# Patient Record
Sex: Male | Born: 1985 | Race: Black or African American | Hispanic: No | Marital: Single | Smoking: Current every day smoker
Health system: Southern US, Community
[De-identification: ages and names within clinical notes are randomized; demographics above are authoritative.]

---
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
## 2009-11-15 ENCOUNTER — Emergency Department (HOSPITAL_COMMUNITY): Admission: EM | Admit: 2009-11-15 | Discharge: 2009-11-15 | Payer: Self-pay | Admitting: Emergency Medicine

## 2010-03-26 ENCOUNTER — Emergency Department (HOSPITAL_COMMUNITY): Admission: EM | Admit: 2010-03-26 | Discharge: 2010-03-26 | Payer: Self-pay | Admitting: Emergency Medicine

## 2010-08-13 LAB — GC/CHLAMYDIA PROBE AMP, GENITAL: Chlamydia, DNA Probe: NEGATIVE

## 2010-11-25 ENCOUNTER — Emergency Department (HOSPITAL_COMMUNITY)
Admission: EM | Admit: 2010-11-25 | Discharge: 2010-11-25 | Disposition: A | Discharge status: Home / Self Care | Payer: Self-pay | Attending: Emergency Medicine | Admitting: Emergency Medicine

## 2010-11-25 DIAGNOSIS — R112 Nausea with vomiting, unspecified: Secondary | ICD-10-CM | POA: Insufficient documentation

## 2010-11-25 DIAGNOSIS — R197 Diarrhea, unspecified: Secondary | ICD-10-CM | POA: Insufficient documentation

## 2010-11-25 DIAGNOSIS — R1013 Epigastric pain: Secondary | ICD-10-CM | POA: Insufficient documentation

## 2010-11-25 LAB — COMPREHENSIVE METABOLIC PANEL
AST: 24 U/L (ref 0–37)
Albumin: 4.1 g/dL (ref 3.5–5.2)
Calcium: 9.3 mg/dL (ref 8.4–10.5)
Chloride: 96 mEq/L (ref 96–112)
Creatinine, Ser: 1.2 mg/dL (ref 0.50–1.35)
Total Bilirubin: 0.7 mg/dL (ref 0.3–1.2)
Total Protein: 8.4 g/dL — ABNORMAL HIGH (ref 6.0–8.3)

## 2010-11-25 LAB — CBC
MCV: 87.1 fL (ref 78.0–100.0)
Platelets: 255 10*3/uL (ref 150–400)
RDW: 12.8 % (ref 11.5–15.5)
WBC: 5.1 10*3/uL (ref 4.0–10.5)

## 2013-10-01 ENCOUNTER — Telehealth (HOSPITAL_BASED_OUTPATIENT_CLINIC_OR_DEPARTMENT_OTHER): Payer: Self-pay

## 2013-10-01 NOTE — Telephone Encounter (Signed)
State calling to see if pt was tested for syphilis back in 6/11 when he tested (+) for gonorrhea.  No RPR testing found.

## 2016-11-23 ENCOUNTER — Encounter (HOSPITAL_COMMUNITY): Payer: Self-pay | Admitting: *Deleted

## 2016-11-23 ENCOUNTER — Emergency Department (HOSPITAL_COMMUNITY)
Admission: EM | Admit: 2016-11-23 | Discharge: 2016-11-23 | Disposition: A | Discharge status: Home / Self Care | Payer: Self-pay | Attending: Emergency Medicine | Admitting: Emergency Medicine

## 2016-11-23 DIAGNOSIS — F1910 Other psychoactive substance abuse, uncomplicated: Secondary | ICD-10-CM | POA: Insufficient documentation

## 2016-11-23 DIAGNOSIS — F17210 Nicotine dependence, cigarettes, uncomplicated: Secondary | ICD-10-CM | POA: Insufficient documentation

## 2016-11-23 DIAGNOSIS — Z79899 Other long term (current) drug therapy: Secondary | ICD-10-CM | POA: Insufficient documentation

## 2016-11-23 LAB — COMPREHENSIVE METABOLIC PANEL
ALBUMIN: 4.3 g/dL (ref 3.5–5.0)
ALT: 25 U/L (ref 17–63)
ANION GAP: 7 (ref 5–15)
AST: 30 U/L (ref 15–41)
Alkaline Phosphatase: 97 U/L (ref 38–126)
BUN: 8 mg/dL (ref 6–20)
CO2: 20 mmol/L — AB (ref 22–32)
Calcium: 9.3 mg/dL (ref 8.9–10.3)
Chloride: 109 mmol/L (ref 101–111)
Creatinine, Ser: 1.15 mg/dL (ref 0.61–1.24)
GFR calc Af Amer: >60 mL/min (ref >60)
GFR calc non Af Amer: >60 mL/min (ref >60)
GLUCOSE: 83 mg/dL (ref 65–99)
POTASSIUM: 3.6 mmol/L (ref 3.5–5.1)
SODIUM: 136 mmol/L (ref 135–145)
Total Bilirubin: 0.7 mg/dL (ref 0.3–1.2)
Total Protein: 7.7 g/dL (ref 6.5–8.1)

## 2016-11-23 LAB — RAPID URINE DRUG SCREEN, HOSP PERFORMED
AMPHETAMINES: NOT DETECTED
Barbiturates: NOT DETECTED
Benzodiazepines: NOT DETECTED
Cocaine: POSITIVE — AB
Opiates: NOT DETECTED
TETRAHYDROCANNABINOL: POSITIVE — AB

## 2016-11-23 LAB — ETHANOL: Alcohol, Ethyl (B): <5 mg/dL (ref <5)

## 2016-11-23 LAB — CBC
HCT: 41.6 % (ref 39.0–52.0)
HEMOGLOBIN: 14.6 g/dL (ref 13.0–17.0)
MCH: 31.6 pg (ref 26.0–34.0)
MCHC: 35.1 g/dL (ref 30.0–36.0)
MCV: 90 fL (ref 78.0–100.0)
Platelets: 223 10*3/uL (ref 150–400)
RBC: 4.62 MIL/uL (ref 4.22–5.81)
RDW: 12.7 % (ref 11.5–15.5)
WBC: 5.1 10*3/uL (ref 4.0–10.5)

## 2016-11-23 MED ORDER — METHOCARBAMOL 500 MG PO TABS
500.0000 mg | ORAL_TABLET | Freq: Three times a day (TID) | ORAL | Status: DC | PRN
Start: 2016-11-23 — End: 2016-11-23

## 2016-11-23 MED ORDER — LOPERAMIDE HCL 2 MG PO CAPS: 2.0000 mg | ORAL_CAPSULE | ORAL | Status: DC | PRN

## 2016-11-23 MED ORDER — NICOTINE 21 MG/24HR TD PT24
21.0000 mg | MEDICATED_PATCH | Freq: Every day | TRANSDERMAL | Status: DC
  Administered 2016-11-23: 21 mg via TRANSDERMAL
  Filled 2016-11-23: qty 1

## 2016-11-23 MED ORDER — NAPROXEN 250 MG PO TABS: 500.0000 mg | ORAL_TABLET | Freq: Two times a day (BID) | ORAL | Status: DC | PRN

## 2016-11-23 MED ORDER — DICYCLOMINE HCL 20 MG PO TABS: 20.0000 mg | ORAL_TABLET | Freq: Four times a day (QID) | ORAL | Status: DC | PRN

## 2016-11-23 MED ORDER — ONDANSETRON 4 MG PO TBDP: 4.0000 mg | ORAL_TABLET | Freq: Four times a day (QID) | ORAL | Status: DC | PRN

## 2016-11-23 MED ORDER — HYDROXYZINE HCL 25 MG PO TABS: 25.0000 mg | ORAL_TABLET | Freq: Four times a day (QID) | ORAL | Status: DC | PRN

## 2016-11-23 NOTE — ED Notes (Signed)
TTS in progress. Computer malfunctioning, pt using RN's phone [facility] while remaining on video monitor.

## 2016-11-23 NOTE — Discharge Instructions (Addendum)
Substance Abuse Treatment Programs ° °Intensive Outpatient Programs °[facility]     °[address]      °[place], [place]                   °[phone]      ° °[facility] °[address] °[place], [place] °[phone] ° °[facility]     °(Inpatient and outpatient)     °[address]           °[phone]   ° °[facility] °[phone] (Suboxone and Methadone) ° °[address]      °[place], [place] [address]      °[phone]      ° °[address] °[place], [place] °852-3033 ° °[facility] (Outpatient/Inpatient, [facility])    °(insurance only) [phone]      °       °[facility] (Groups & Residential) °[place], [place] °[phone] ° °   °[facility]     °[address]     °[place], [place]      °[phone]      ° °[facility] (for caregivers and family) °[address] °[place], [place] °336-299-4655 ° ° ° ° ° °Residential Treatment Programs °[facility]      °[address], [place], [place] [address]  °([phone]      ° °[facility] °[address] [place], [place] [address] °[phone] ° °[facility]        °336-248-8914      ° °[facility] °1-800-659-3381 ° °[facility]             °[address]                                         °[place], [place]                                                °877-615-2722 or 336-784-9470                              ° °[facility] [place] °[address] °[place], [address] °1.877.941.8954 ° °[facility]    °[address]      °[place], [place]     °[phone]      ° °[facility] °[address] °[place], [place] °336-285-9073 ° °[facility]   °[address]     °[place], [place] [address]     °[phone]      °Admissions: 8am-3pm M-F ° °[facility] °[address] °[place],  [place] °336-227-7417 ° °[facility]: Residential Program (90 Days)   °[place], [place]      °336-725-8389 or 800-758-6077    ° °[facility] °[place], [place] °(Walk in Hours over the weekend or by referral) ° °[facility] °[address], [place],  [address] °([phone] ° °Crisis Mobile: [facility]:  1-877-626-1772 (for crisis response 24 hours a day) °[facility] Hotline:      1-800-256-2452 °Outpatient Psychiatry and Counseling ° °[facility]: Mobile Crisis   Management 24 hours:  1-877-626-1772 ° °[facility] sliding scale fee and walk in schedule: M-F 8am-12pm/1pm-3pm °[address]  °[place], [place] [address] °[phone] ° °[facility] °[address] °[place], [place] [address] °336-703-9650 ° °[facility] ([facility])- new patient walk-in appointments available Monday - Friday 8am -3pm.          °[address] °[place], [place] [address] °336-676-6840 or crisis line- 336-676-6905 ° °[facility] Intensive Outpatient Therapy Program °[address] °[place], [place] [address] °336-832-9804 ° °[facility]                  °Crisis Services      °[phone]      °[address]     °[place], [place] [address]                ° °[facility]   °[facility] °[phone] °[address] °[place], [place] [address] ° ° °[facility]          °[address],  °[place], [place] [address]       °[phone] ° °[facility] °[address] °[place], [place] [address] °336-292-1510 ° °[facility]    °[address]    °[place], [place] [address]     °[phone]      ° °Parish McKinney, MD     °[address]     °[place] [address]     °336-282-1251     °  °[facility] °[address] °[place] [address] ° °[facility]     °[address]     °[place], [place]      °336-542-2076      ° °[facility] °Shamsher Ahluwalia, MD °[address] °[place], [place] [address] °[phone] ° °[facility]     °[address]     °[place], [place] [address]     °336-274-1237      ° °[facility] °[address] °[place], [place] [address] °336-854-4450 °Resources for Temporary Residential Assistance/Crisis Centers ° °DAY CENTERS °[facility] °M-F 8am-3pm   °[address] [place], [place] [address]   336-332-0824 °Services include: laundry, barbering, support groups, case management, phone  & computer access, showers, AA/NA mtgs, mental health/substance abuse nurse, job skills class, disability information, VA assistance, spiritual classes, etc.  ° °HOMELESS SHELTERS ° °[facility]     °[facility]   °[address], [place]     °[phone]       °       °[facility] (women and children)       °[address] °[place], [place] [address] °336-275-0820 °[email or web address] for application and process °Application Required ° °[facility]   °[address]    °[place] [address]     °[phone]       °             °[facility] °[address] °, [place] [address] °336.273.5572 °336-235-0363(schedule application appt.) °Application Required ° °[facility] (women only)    °[address]     °[place],  [address]     °336-884-1039      °  Intake starts 6pm daily °Need valid ID, SSC, & Police report °[facility] [place] °[address] °[place], [place] °336-881-5420 °Application Required ° °[facility] (men only)     °[address]      °[place], [place]     °[phone]      ° °[facility] °(Pregnant women only) °[address] °[place], [place] °336-275-0206 ° °[facility]      °[address]      °[place], [place] [address]     °[phone]      °       °[facility] [place] [facility] °[address] °[place], [place] °336-723-1848 °90 day commitment/SA/Application process ° °[facility](men only)     °[address]     °[place], [place]     °[phone]       °Check-in at 7pm     °       °[facility] °[address] °[place], [place] [address] °336-248-6684 °Men/Women/Women and Children must be there by 7 pm ° °[facility] °[place], [place] °336-722-8721                ° °

## 2016-11-23 NOTE — BH Assessment (Signed)
Tele Assessment Note   Albert Blake is an 31 y.o. male who came to the ED seeking residential treatment for substance abuse. He states that he is "tired of using drugs and alcohol and wants help". He denies SI, HI and AVH but states that his use makes him depressed. He states that he often does "things he isn't proud of" when he is trying to get drugs and ends up feeling guilty and depressed the next day which triggers him to use more. He states that he was in Prison for a total of 4 years for several felony breaking and entering. (He states that this was to get money to get drugs). Pt got out of Prison 2 years ago and started using again. He states that he uses alcohol, marijuana, cocaine, xanex and opiates daily (if he can get xanex and opiates). He reports that he has "burned all his bridges" and has no family support. He is currently homeless and stays at friends houses or on the streets. Pt denies any suicide attempts previously and has never had an inpatient admission or outpatient therapy.   Per Hillery Jacks NP it is recommended that pt be discharged with substance abuse treatment resources.   Diagnosis: Substance induced depressive disorder, Cocaine use disorder, moderate, Opoid use disorder, moderate, Cannabis use disorder severe, alcohol use disorder moderate,   Past Medical History: History reviewed. No pertinent past medical history.  History reviewed. No pertinent surgical history.  Family History: No family history on file.  Social History:  reports that he has been smoking Cigarettes.  He has been smoking about 1.00 pack per day. He has never used smokeless tobacco. He reports that he drinks alcohol. He reports that he uses drugs, including Cocaine.  Additional Social History:  Alcohol / Drug Use Pain Medications: Abuses pain medications when he has access  Prescriptions: uses prescription drugs whenever he can (xanex)  Over the Counter: NA  History of alcohol / drug use?:  Yes Longest period of sobriety (when/how long): 4 years- (was in prison)  Negative Consequences of Use: Financial, Armed forces operational officer, Personal relationships, Work / School Substance #1 Name of Substance 1: Alcohol  1 - Age of First Use: 16 1 - Amount (size/oz): 2 pints liquor  1 - Frequency: daily  1 - Duration: on and off since age 64  1 - Last Use / Amount: yesterday unknown (BAL 0) Substance #2 Name of Substance 2: Marijuana  2 - Age of First Use: 16  2 - Amount (size/oz): 4 blunts 2 - Frequency: daily  2 - Duration: on and off since age 19 2 - Last Use / Amount: today  Substance #3 Name of Substance 3: Cocaine  3 - Age of First Use: 16  3 - Amount (size/oz): unknown 3 - Frequency: daily  3 - Duration: on and off since age 40 3 - Last Use / Amount: last night Substance #4 Name of Substance 4: Xanex  4 - Age of First Use: 64 4 - Amount (size/oz): unknown (whatever he can get)  4 - Frequency: whenever he can get it 4 - Duration: 2 years 4 - Last Use / Amount: unspecified- pt negative Substance #5 Name of Substance 5: Opiates  5 - Age of First Use: 29 5 - Amount (size/oz): unknown (whatever he can get) 5 - Frequency: daily when he can get it 5 - Duration: 2 years  5 - Last Use / Amount: unspecified- pt negative   CIWA: CIWA-Ar BP: (!) 145/89 Pulse  Rate: 78 Nausea and Vomiting: mild nausea with no vomiting Tactile Disturbances: none Tremor: no tremor Auditory Disturbances: not present Paroxysmal Sweats: no sweat visible Visual Disturbances: not present Anxiety: no anxiety, at ease Headache, Fullness in Head: none present Agitation: normal activity Orientation and Clouding of Sensorium: oriented and can do serial additions CIWA-Ar Total: 1 COWS: Clinical Opiate Withdrawal Scale (COWS) Resting Pulse Rate: Pulse Rate 81-100 Sweating: No report of chills or flushing Restlessness: Able to sit still Pupil Size: Pupils pinned or normal size for room light Bone or Joint Aches:  Not present Runny Nose or Tearing: Not present GI Upset: No GI symptoms Tremor: No tremor Yawning: No yawning Anxiety or Irritability: None Gooseflesh Skin: Skin is smooth COWS Total Score: 1  PATIENT STRENGTHS: (choose at least two) Average or above average intelligence Motivation for treatment/growth  Allergies: No Known Allergies  Home Medications:  (Not in a hospital admission)  OB/GYN Status:  No LMP for male patient.  General Assessment Data [facility] Assessment: In system Is this a Tele or Face-to-Face Assessment?: Tele Assessment Is this an Initial Assessment or a Re-assessment for this encounter?: Initial Assessment Marital status: Single Maiden name: NA Is patient pregnant?: No Pregnancy Status: No Living Arrangements: Other (Comment) (Homeless) Can pt return to current living arrangement?: Yes Admission Status: Voluntary Is patient capable of signing voluntary admission?: Yes Referral Source: Self/Family/Friend Insurance type: Self- Pay     Crisis Care Plan Living Arrangements: Other (Comment) (Homeless) Name of Psychiatrist: None Name of Therapist: None  Education Status Is patient currently in school?: No Highest grade of school patient has completed: 10th  Risk to self with the past 6 months Suicidal Ideation: No Has patient been a risk to self within the past 6 months prior to admission? : Yes (due to chronic drug use and criminal activity) Suicidal Intent: No Has patient had any suicidal intent within the past 6 months prior to admission? : No Is patient at risk for suicide?: No Suicidal Plan?: No Has patient had any suicidal plan within the past 6 months prior to admission? : No Access to Means: No What has been your use of drugs/alcohol within the last 12 months?: Using drugs and alcohol daily  Previous Attempts/Gestures: No How many times?: 0 Other Self Harm Risks: no Triggers for Past Attempts: None  known Intentional Self Injurious Behavior: None Family Suicide History: No Recent stressful life event(s): Other (Comment) Persecutory voices/beliefs?: No Depression: Yes Depression Symptoms: Despondent, Feeling worthless/self pity, Feeling angry/irritable Substance abuse history and/or treatment for substance abuse?: Yes Suicide prevention information given to non-admitted patients: Not applicable  Risk to Others within the past 6 months Homicidal Ideation: No Does patient have any lifetime risk of violence toward others beyond the six months prior to admission? : No Thoughts of Harm to Others: Yes-Currently Present Comment - Thoughts of Harm to Others:  (thoughts to harm others when he can't get drugs) Current Homicidal Intent: No Current Homicidal Plan: No Access to Homicidal Means: No Describe Access to Homicidal Means: none Identified Victim:  (None) History of harm to others?: No Assessment of Violence: None Noted Violent Behavior Description: no Does patient have access to weapons?: No Criminal Charges Pending?: No Does patient have a court date: No Is patient on probation?: Unknown  Psychosis Hallucinations: None noted Delusions: None noted  Mental Status Report Appearance/Hygiene: Unremarkable Eye Contact: Good Motor Activity: Freedom of movement Speech: Logical/coherent Level of Consciousness: Alert Mood: Depressed Affect: Appropriate to  circumstance Anxiety Level: Moderate Thought Processes: Coherent Judgement: Partial Orientation: Person, Place, Time, Situation Obsessive Compulsive Thoughts/Behaviors: Moderate  Cognitive Functioning Concentration: Normal Memory: Recent Intact, Remote Intact IQ: Average Insight: Fair Impulse Control: Poor Appetite: Good Weight Loss: 0 Weight Gain: 0 Sleep: Decreased Total Hours of Sleep: 5 Vegetative Symptoms: None  ADLScreening [facility] Assessment Services) Patient's cognitive ability adequate to safely complete  daily activities?: Yes Patient able to express need for assistance with ADLs?: Yes Independently performs ADLs?: Yes (appropriate for developmental age)  Prior Inpatient Therapy Prior Inpatient Therapy: No  Prior Outpatient Therapy Prior Outpatient Therapy: No Reason for Treatment: no Does patient have an ACCT team?: No Does patient have Intensive In-House Services?  : No Does patient have [facility] services? : No Does patient have [facility] services?: No  ADL Screening (condition at time of admission) Patient's cognitive ability adequate to safely complete daily activities?: Yes Is the patient deaf or have difficulty hearing?: No Does the patient have difficulty seeing, even when wearing glasses/contacts?: No Does the patient have difficulty concentrating, remembering, or making decisions?: No Patient able to express need for assistance with ADLs?: Yes Does the patient have difficulty dressing or bathing?: No Independently performs ADLs?: Yes (appropriate for developmental age) Does the patient have difficulty walking or climbing stairs?: No Weakness of Legs: None Weakness of Arms/Hands: None  Home Assistive Devices/Equipment Home Assistive Devices/Equipment: None  Therapy Consults (therapy consults require a physician order) PT Evaluation Needed: No OT Evalulation Needed: No SLP Evaluation Needed: No Abuse/Neglect Assessment (Assessment to be complete while patient is alone) Physical Abuse: Denies Verbal Abuse: Denies Sexual Abuse: Denies Exploitation of patient/patient's resources: Denies Self-Neglect: Denies Values / Beliefs Cultural Requests During Hospitalization: None Spiritual Requests During Hospitalization: None Consults Spiritual Care Consult Needed: No Social Work Consult Needed: No Merchant navy officer (For Healthcare) Does Patient Have a Medical Advance Directive?: No Would patient like information on creating a medical advance directive?: No - Patient  declined Nutrition Screen- [facility] Adult/WL/AP Patient's home diet: Regular Has the patient recently lost weight without trying?: No Has the patient been eating poorly because of a decreased appetite?: No Malnutrition Screening Tool Score: 0  Additional Information 1:1 In Past 12 Months?: No CIRT Risk: No Elopement Risk: No Does patient have medical clearance?: No     Disposition:  Disposition Initial Assessment Completed for this Encounter: Yes Disposition of Patient: Outpatient treatment Type of outpatient treatment: Adult  Lanice Shirts [facility] 11/23/2016 1:44 PM

## 2016-11-23 NOTE — ED Provider Notes (Signed)
Emergency Department Provider Note   I have reviewed the triage vital signs and the nursing notes.   HISTORY  Chief Complaint Drug Problem and Suicidal   HPI Albert Blake is a 31 y.o. male with PMH of polysubstance abuse and tobacco use presents to the emergency department for evaluation of increasing depression and thoughts of self-harm in the setting of polysubstance abuse. Patient states that his depression and thoughts of harming himself occur mainly when he is unable to get adequately "high" on drugs or cannot find drugs. He describes instances where he feels like punching a wall or hurting himself in some other way but denies specific suicidal thinking or planning. No history of suicide attempts in the past. He drinks approximately 2 pints of liquor daily and has not had a history of complicated alcohol withdrawal. He also takes prescription medications which she gets from the street. He states he frequently takes opioids and occasionally Ativan or Xanax. He last took pills and drank alcohol yesterday evening. Also endorses using cocaine at times. Denies any current homicidal ideation but states he does feel angry toward people when he is not high. Denies any auditory or visual hallucinations. No medical complaints. Has attended drug rehab in prison but none since.    History reviewed. No pertinent past medical history.  There are no active problems to display for this patient.   History reviewed. No pertinent surgical history.    Allergies Patient has no known allergies.  No family history on file.  Social History Social History  Substance Use Topics  . Smoking status: Current Every Day Smoker    Packs/day: 1.00    Types: Cigarettes  . Smokeless tobacco: Never Used  . Alcohol use Yes     Comment: pt states, " I drink about 2 bottles a day."    Review of Systems  Constitutional: No fever/chills Eyes: No visual changes. ENT: No sore throat. Cardiovascular:  Denies chest pain. Respiratory: Denies shortness of breath. Gastrointestinal: No abdominal pain.  No nausea, no vomiting.  No diarrhea.  No constipation. Genitourinary: Negative for dysuria. Musculoskeletal: Negative for back pain. Skin: Negative for rash. Neurological: Negative for headaches, focal weakness or numbness.  10-point ROS otherwise negative.  ____________________________________________   PHYSICAL EXAM:  VITAL SIGNS: ED Triage Vitals  Enc Vitals Group     BP 11/23/16 0949 136/85     Pulse Rate 11/23/16 0949 85     Resp 11/23/16 0949 18     Temp 11/23/16 0949 98.1 F (36.7 C)     Temp Source 11/23/16 0949 Oral     SpO2 11/23/16 0949 99 %     Weight 11/23/16 0949 197 lb (89.4 kg)     Height 11/23/16 0949 6\' 2"  (1.88 m)     Pain Score 11/23/16 1139 0   Constitutional: Alert and oriented. Well appearing and in no acute distress. Eyes: Conjunctivae are normal.  Head: Atraumatic. Nose: No congestion/rhinnorhea. Mouth/Throat: Mucous membranes are moist.  Oropharynx non-erythematous. Neck: No stridor.   Cardiovascular: Normal rate, regular rhythm. Good peripheral circulation. Grossly normal heart sounds.   Respiratory: Normal respiratory effort.  No retractions. Lungs CTAB. Gastrointestinal: Soft and nontender. No distention.  Musculoskeletal: No lower extremity tenderness nor edema. No gross deformities of extremities. Neurologic:  Normal speech and language. No gross focal neurologic deficits are appreciated.  Skin:  Skin is warm, dry and intact. No rash noted. Psychiatric: Mood and affect are normal. Speech and behavior are normal.  ____________________________________________  LABS (all labs ordered are listed, but only abnormal results are displayed)  Labs Reviewed  COMPREHENSIVE METABOLIC PANEL - Abnormal; Notable for the following:       Result Value   CO2 20 (*)    All other components within normal limits  RAPID URINE DRUG SCREEN, HOSP PERFORMED -  Abnormal; Notable for the following:    Cocaine POSITIVE (*)    Tetrahydrocannabinol POSITIVE (*)    All other components within normal limits  ETHANOL  CBC   ____________________________________________   PROCEDURES  Procedure(s) performed:   Procedures  None ____________________________________________   INITIAL IMPRESSION / ASSESSMENT AND PLAN / ED COURSE  Pertinent labs & imaging results that were available during my care of the patient were reviewed by me and considered in my medical decision making (see chart for details).  Patient resents to the emergency department for evaluation of polysubstance abuse and thoughts of self-harm. No specific suicidal thinking at this time. The patient reports having erratic behavior when he is not able to get drugs or get adequately high which leads to thoughts of hurting himself but seems less consistent with actual suicidal thinking/attempt. Feels aggressive toward others, also only when not high. He is calm and cooperative at this time. Does not appear intoxicated. Plan for TTS evaluation. Will place PRN orders for opioid withdrawal and CIWA. Patient is voluntary at this time.  Labs reviewed and PRN medications ordered. Medically clear at this time for TTS evaluation.   01:20 PM Spoke with behavioral health. Patient does not meet inpatient criteria. They recommend outpatient drug treatment. I will provide resources. They also discussed local resources with the patient in detail. He denies any suicidal or homicidal ideation at this time.   At this time, I do not feel there is any life-threatening condition present. I have reviewed and discussed all results (EKG, imaging, lab, urine as appropriate), exam findings with patient. I have reviewed nursing notes and appropriate previous records.  I feel the patient is safe to be discharged home without further emergent workup. Discussed usual and customary return precautions. Patient and family (if  present) verbalize understanding and are comfortable with this plan.  Patient will follow-up with their primary care provider. If they do not have a primary care provider, information for follow-up has been provided to them. All questions have been answered.  ____________________________________________  FINAL CLINICAL IMPRESSION(S) / ED DIAGNOSES  Final diagnoses:  Polysubstance abuse     MEDICATIONS GIVEN DURING THIS VISIT:  Medications - No data to display   NEW OUTPATIENT MEDICATIONS STARTED DURING THIS VISIT:  There are no discharge medications for this patient.     Note:  This document was prepared using Dragon voice recognition software and may include unintentional dictation errors.  Alona Bene, MD Emergency Medicine   Long, Arlyss Repress, MD 11/23/16 2012

## 2016-11-23 NOTE — BHH Counselor (Signed)
Resources faxed over. Attention to Dr. Gerarda GuntherLong  Retal Tonkinson [facility] LPC, LCAS

## 2016-11-25 ENCOUNTER — Emergency Department (HOSPITAL_COMMUNITY)
Admission: EM | Admit: 2016-11-25 | Discharge: 2016-11-26 | Discharge status: Against Medical Advice | Payer: Self-pay | Attending: Emergency Medicine | Admitting: Emergency Medicine

## 2016-11-25 ENCOUNTER — Encounter (HOSPITAL_COMMUNITY): Payer: Self-pay | Admitting: *Deleted

## 2016-11-25 DIAGNOSIS — F1919 Other psychoactive substance abuse with unspecified psychoactive substance-induced disorder: Secondary | ICD-10-CM | POA: Insufficient documentation

## 2016-11-25 DIAGNOSIS — R45851 Suicidal ideations: Secondary | ICD-10-CM | POA: Insufficient documentation

## 2016-11-25 DIAGNOSIS — R030 Elevated blood-pressure reading, without diagnosis of hypertension: Secondary | ICD-10-CM | POA: Insufficient documentation

## 2016-11-25 DIAGNOSIS — F17210 Nicotine dependence, cigarettes, uncomplicated: Secondary | ICD-10-CM | POA: Insufficient documentation

## 2016-11-25 DIAGNOSIS — F10920 Alcohol use, unspecified with intoxication, uncomplicated: Secondary | ICD-10-CM | POA: Insufficient documentation

## 2016-11-25 LAB — RAPID URINE DRUG SCREEN, HOSP PERFORMED
AMPHETAMINES: NOT DETECTED
BENZODIAZEPINES: NOT DETECTED
Barbiturates: NOT DETECTED
COCAINE: POSITIVE — AB
Opiates: NOT DETECTED
Tetrahydrocannabinol: POSITIVE — AB

## 2016-11-25 LAB — COMPREHENSIVE METABOLIC PANEL
ALT: 26 U/L (ref 17–63)
ANION GAP: 10 (ref 5–15)
AST: 38 U/L (ref 15–41)
Albumin: 4.2 g/dL (ref 3.5–5.0)
Alkaline Phosphatase: 108 U/L (ref 38–126)
BILIRUBIN TOTAL: 0.5 mg/dL (ref 0.3–1.2)
BUN: 10 mg/dL (ref 6–20)
CHLORIDE: 106 mmol/L (ref 101–111)
CO2: 23 mmol/L (ref 22–32)
Calcium: 9.5 mg/dL (ref 8.9–10.3)
Creatinine, Ser: 1.2 mg/dL (ref 0.61–1.24)
GFR calc Af Amer: >60 mL/min (ref >60)
Glucose, Bld: 100 mg/dL — ABNORMAL HIGH (ref 65–99)
POTASSIUM: 3.9 mmol/L (ref 3.5–5.1)
Sodium: 139 mmol/L (ref 135–145)
TOTAL PROTEIN: 7.6 g/dL (ref 6.5–8.1)

## 2016-11-25 LAB — CBC
HEMATOCRIT: 43.3 % (ref 39.0–52.0)
Hemoglobin: 14.9 g/dL (ref 13.0–17.0)
MCH: 31 pg (ref 26.0–34.0)
MCHC: 34.4 g/dL (ref 30.0–36.0)
MCV: 90.2 fL (ref 78.0–100.0)
Platelets: 225 10*3/uL (ref 150–400)
RBC: 4.8 MIL/uL (ref 4.22–5.81)
RDW: 13 % (ref 11.5–15.5)
WBC: 5 10*3/uL (ref 4.0–10.5)

## 2016-11-25 LAB — SALICYLATE LEVEL: Salicylate Lvl: <7 mg/dL (ref 2.8–30.0)

## 2016-11-25 LAB — ETHANOL: Alcohol, Ethyl (B): <5 mg/dL (ref <5)

## 2016-11-25 LAB — ACETAMINOPHEN LEVEL

## 2016-11-25 MED ORDER — ACETAMINOPHEN 500 MG PO TABS: 1000.0000 mg | ORAL_TABLET | Freq: Four times a day (QID) | ORAL | Status: DC | PRN

## 2016-11-25 NOTE — BH Assessment (Signed)
Tele Assessment Note   Albert Blake is an 31 y.o. male who came to the ED with suicidal ideations with a plan to drown himself in a pool (pt can't swim). He states that he was just in the ED on Friday (note below) seeking treatment for substance abuse and depression and was discharged with resources. He states that he tried to follow up with the residential facilites listed but by that time they were not taking any new patients and he relapsed on cocaine. He states that he felt guilty and bad about himself and thought "he might as well end it". He states that he was looking at the pool and was ready to jump but saw himself in the reflection and stopped because he didn't want to do that to his children. Pt states that he wants to be in his childrens life but addiction has prevented this from happening. He states that he has no contact with his family because of his addiction and is homeless. Pt slept in a stairwell and in a park the last two days. Pt states that he wants to get help and get clean but he feels hopeless because he has tried so many times and relapsed. Pt does not feel safe to leave and currently feels depressed and suicidal.   Assessment from 11/23/16: Albert Blake is an 31 y.o. male who came to the ED seeking residential treatment for substance abuse. He states that he is "tired of using drugs and alcohol and wants help". He denies SI, HI and AVH but states that his use makes him depressed. He states that he often does "things he isn't proud of" when he is trying to get drugs and ends up feeling guilty and depressed the next day which triggers him to use more. He states that he was in Prison for a total of 4 years for several felony breaking and entering. (He states that this was to get money to get drugs). Pt got out of Prison 2 years ago and started using again. He states that he uses alcohol, marijuana, cocaine, xanex and opiates daily (if he can get xanex and opiates). He reports  that he has "burned all his bridges" and has no family support. He is currently homeless and stays at friends houses or on the streets. Pt denies any suicide attempts previously and has never had an inpatient admission or outpatient therapy.   Disposition: Inpatient recommended per Fransisca Kaufmann NP   Diagnosis: Major Depressive Disorder, single episode severe, Cocaine use disorder, moderate, Opoid use disorder, moderate, Cannabis use disorder severe, alcohol use disorder moderate,    Past Medical History: History reviewed. No pertinent past medical history.  History reviewed. No pertinent surgical history.  Family History: History reviewed. No pertinent family history.  Social History:  reports that he has been smoking Cigarettes.  He has been smoking about 1.00 pack per day. He has never used smokeless tobacco. He reports that he drinks alcohol. He reports that he uses drugs, including Cocaine.  Additional Social History:  Alcohol / Drug Use Pain Medications: Abuses pain medications when he has access  Prescriptions: uses prescription drugs whenever he can (xanex)  Over the Counter: NA  History of alcohol / drug use?: Yes Longest period of sobriety (when/how long): 4 years- (was in prison)  Negative Consequences of Use: Financial, Legal, Personal relationships, Work / School Substance #1 1 - Age of First Use: 16 1 - Amount (size/oz): 2 pints liquor  1 - Frequency: daily  1 - Duration: on and off since age 4  1 - Last Use / Amount: 3 days ago Substance #2 Name of Substance 2: Marijuana  2 - Age of First Use: 16  2 - Amount (size/oz): 4 blunts 2 - Frequency: daily  2 - Duration: on and off since age 70 2 - Last Use / Amount: 2 days ago Substance #3 Name of Substance 3: Cocaine  3 - Age of First Use: 16  3 - Amount (size/oz): unknown 3 - Frequency: daily  3 - Duration: on and off since age 2 3 - Last Use / Amount: last night Substance #4 Name of Substance 4: Xanex  4 - Age  of First Use: 58 4 - Amount (size/oz): unknown (whatever he can get)  4 - Frequency: whenever he can get it 4 - Duration: 2 years 4 - Last Use / Amount: unspecified- pt negative Substance #5 Name of Substance 5: Opiates  5 - Age of First Use: 29 5 - Amount (size/oz): unknown (whatever he can get) 5 - Frequency: daily when he can get it 5 - Duration: 2 years  5 - Last Use / Amount: unspecified- pt negative   CIWA: CIWA-Ar BP: (!) 148/106 Pulse Rate: 88 COWS:    PATIENT STRENGTHS: (choose at least two) Average or above average intelligence General fund of knowledge  Allergies: No Known Allergies  Home Medications:  (Not in a hospital admission)  OB/GYN Status:  No LMP for male patient.  General Assessment Data TTS Assessment: In system Is this a Tele or Face-to-Face Assessment?: Tele Assessment Is this an Initial Assessment or a Re-assessment for this encounter?: Initial Assessment Marital status: Single Maiden name: NA Is patient pregnant?: No Pregnancy Status: No Living Arrangements: Other (Comment) (Homeless sleeping at the park and in a stairwell) Can pt return to current living arrangement?: No Admission Status: Voluntary Is patient capable of signing voluntary admission?: Yes Referral Source: Self/Family/Friend Insurance type: se     Crisis Care Plan Living Arrangements: Other (Comment) (Homeless sleeping at the park and in a stairwell) Name of Psychiatrist: None Name of Therapist: None  Education Status Is patient currently in school?: No Highest grade of school patient has completed: 10th  Risk to self with the past 6 months Suicidal Ideation: Yes-Currently Present Has patient been a risk to self within the past 6 months prior to admission? : Yes Suicidal Intent: Yes-Currently Present Has patient had any suicidal intent within the past 6 months prior to admission? : Yes Is patient at risk for suicide?: Yes Suicidal Plan?: Yes-Currently Present Has  patient had any suicidal plan within the past 6 months prior to admission? : Yes Specify Current Suicidal Plan: pt had a plan to jump into a pool and drown (pt can't swim) Access to Means: Yes Specify Access to Suicidal Means: access to a pool What has been your use of drugs/alcohol within the last 12 months?: using drugs and alcohol to excess daily Previous Attempts/Gestures: No How many times?: 0 Other Self Harm Risks: chronic drug use Triggers for Past Attempts: None known Intentional Self Injurious Behavior: None Family Suicide History: No Recent stressful life event(s): Other (Comment) Persecutory voices/beliefs?: No Depression: Yes Depression Symptoms: Despondent, Isolating, Feeling worthless/self pity, Feeling angry/irritable Substance abuse history and/or treatment for substance abuse?: Yes Suicide prevention information given to non-admitted patients: Not applicable  Risk to Others within the past 6 months Homicidal Ideation: No Does patient have any lifetime risk of violence toward others beyond the  six months prior to admission? : No Thoughts of Harm to Others: No Comment - Thoughts of Harm to Others: No thoughts today Current Homicidal Intent: No Current Homicidal Plan: No Access to Homicidal Means: No Describe Access to Homicidal Means: no Identified Victim: none History of harm to others?: No Assessment of Violence: None Noted Violent Behavior Description: no Does patient have access to weapons?: No Criminal Charges Pending?: No Does patient have a court date: No Is patient on probation?: Unknown  Psychosis Hallucinations: None noted Delusions: None noted  Mental Status Report Appearance/Hygiene: Unremarkable Eye Contact: Good Motor Activity: Freedom of movement Speech: Logical/coherent Level of Consciousness: Alert Mood: Depressed Affect: Appropriate to circumstance Anxiety Level: Moderate Thought Processes: Coherent Judgement: Partial Orientation:  Person, Place, Time, Situation Obsessive Compulsive Thoughts/Behaviors: Moderate  Cognitive Functioning Concentration: Normal Memory: Recent Intact, Remote Intact IQ: Average Insight: Fair Impulse Control: Poor Appetite: Good Weight Loss: 0 Weight Gain: 0 Sleep: Decreased Total Hours of Sleep: 5 Vegetative Symptoms: None  ADLScreening [facility] Assessment Services) Patient's cognitive ability adequate to safely complete daily activities?: Yes Patient able to express need for assistance with ADLs?: Yes Independently performs ADLs?: Yes (appropriate for developmental age)  Prior Inpatient Therapy Prior Inpatient Therapy: No  Prior Outpatient Therapy Prior Outpatient Therapy: No Does patient have an ACCT team?: No Does patient have Intensive In-House Services?  : No Does patient have [facility] services? : No Does patient have P4CC services?: No  ADL Screening (condition at time of admission) Patient's cognitive ability adequate to safely complete daily activities?: Yes Is the patient deaf or have difficulty hearing?: No Does the patient have difficulty seeing, even when wearing glasses/contacts?: No Does the patient have difficulty concentrating, remembering, or making decisions?: No Patient able to express need for assistance with ADLs?: Yes Does the patient have difficulty dressing or bathing?: No Independently performs ADLs?: Yes (appropriate for developmental age) Does the patient have difficulty walking or climbing stairs?: No Weakness of Legs: None Weakness of Arms/Hands: None  Home Assistive Devices/Equipment Home Assistive Devices/Equipment: None  Therapy Consults (therapy consults require a physician order) PT Evaluation Needed: No OT Evalulation Needed: No SLP Evaluation Needed: No Abuse/Neglect Assessment (Assessment to be complete while patient is alone) Physical Abuse: Denies Verbal Abuse: Denies Sexual Abuse: Denies Exploitation of patient/patient's  resources: Denies Self-Neglect: Denies Values / Beliefs Cultural Requests During Hospitalization: None Spiritual Requests During Hospitalization: None Consults Spiritual Care Consult Needed: No Social Work Consult Needed: No Merchant navy officerAdvance Directives (For Healthcare) Does Patient Have a Medical Advance Directive?: No Would patient like information on creating a medical advance directive?: No - Patient declined Nutrition Screen- [facility] Adult/[facility] Patient's home diet: Regular Has the patient recently lost weight without trying?: No Has the patient been eating poorly because of a decreased appetite?: No Malnutrition Screening Tool Score: 0  Additional Information 1:1 In Past 12 Months?: No CIRT Risk: No Elopement Risk: No Does patient have medical clearance?: Yes     Disposition:  Disposition Initial Assessment Completed for this Encounter: Yes Disposition of Patient: Inpatient treatment program Type of inpatient treatment program: Adult  Jarrett AblesKristin M Hien Perreira 11/25/2016 3:37 PM

## 2016-11-25 NOTE — ED Notes (Signed)
Pt on phone talking w/his sister Irving Burton- Emily - [phone]. Per Secretary, sister was upset d/t stated she had called 3 times in an attempt to reach pt to notify him of their mother being admitted to hospital. Pt noted to laughing w/sister on phone, asking what they ate for dinner, and other social questions.

## 2016-11-25 NOTE — ED Notes (Signed)
Called for sitter, called for security to wand pt, pt in paper scrubs.

## 2016-11-25 NOTE — ED Notes (Signed)
Pt's dinner tray arrived. 

## 2016-11-25 NOTE — ED Triage Notes (Signed)
Pt states being suicidal, his plan is to jump in a pool but he is unable to swim. Hx of SI, does not take any medicines at home. Pt is calm and cooperative at triage.

## 2016-11-25 NOTE — ED Notes (Signed)
TTS at bedside. 

## 2016-11-25 NOTE — ED Notes (Signed)
Pts dinner tray ordered.  

## 2016-11-25 NOTE — ED Notes (Addendum)
Pt noted to be lying on bed w/no shirt on. Pt wearing paper scrub pants. Pt laughing and talking w/Sitter. Toothbrush and tooth paste given as requested.

## 2016-11-25 NOTE — ED Provider Notes (Signed)
[facility]-EMERGENCY DEPT Provider Note   CSN: [id number] Arrival date & time: 11/25/16  1255   Level V caveat psychiatric complaint  History   Chief Complaint Chief Complaint  Patient presents with  . Suicidal    HPI Albert Blake is a 31 y.o. male. Patient feeling suicidal stating he would jump into a pool and drown. He is depressed over substance abuse. He was seen here on 11/23/2016, determined to be suitable for discharge after evaluation by [facility] assessment, he stated he no longer felt suicidal upon discharge however suicidal feelings have returned. He admits to drinking alcohol last night. Admits to snorting cocaine 2 days ago. He is also been using opioid pain medications. Last time 2 days ago. Denies pain anywhere HPI  History reviewed. No pertinent past medical history.  There are no active problems to display for this patient.   History reviewed. No pertinent surgical history.     Home Medications    Prior to Admission medications   Not on File    Family History History reviewed. No pertinent family history.  Social History Social History  Substance Use Topics  . Smoking status: Current Every Day Smoker    Packs/day: 1.00    Types: Cigarettes  . Smokeless tobacco: Never Used  . Alcohol use Yes     Comment: pt states, " I drink about 2 bottles a day."   Positive use of opioid pain medicine. Snorts cocaine  Allergies   Patient has no known allergies.   Review of Systems Review of Systems  Unable to perform ROS: Psychiatric disorder  Psychiatric/Behavioral: Positive for suicidal ideas.     Physical Exam Updated Vital Signs BP (!) 152/106   Pulse 88   Temp 98.5 F (36.9 C) (Oral)   Resp 18   SpO2 98%   Physical Exam  Constitutional: He is oriented to person, place, and time. He appears well-developed and well-nourished.  HENT:  Head: Normocephalic and atraumatic.  Right Ear: External ear normal.  Left Ear: External ear normal.  Eyes: EOM  are normal.  Neck: Neck supple.  Cardiovascular: Normal rate.   Pulmonary/Chest: Effort normal.  Abdominal: He exhibits no distension.  Musculoskeletal: Normal range of motion.  Neurological: He is alert and oriented to person, place, and time. No cranial nerve deficit. Coordination normal.  Gait normal  Nursing note and vitals reviewed.    ED Treatments / Results  Labs (all labs ordered are listed, but only abnormal results are displayed) Labs Reviewed  COMPREHENSIVE METABOLIC PANEL - Abnormal; Notable for the following:       Result Value   Glucose, Bld 100 (*)    All other components within normal limits  ACETAMINOPHEN LEVEL - Abnormal; Notable for the following:    Acetaminophen (Tylenol), Serum <10 (*)    All other components within normal limits  ETHANOL  SALICYLATE LEVEL  CBC  RAPID URINE DRUG SCREEN, HOSP PERFORMED    EKG  EKG Interpretation  Date/Time:  Sunday November 25 2016 14:41:35 EDT Ventricular Rate:  73 PR Interval:    QRS Duration: 86 QT Interval:  403 QTC Calculation: 445 R Axis:   71 Text Interpretation:  Age not entered, assumed to be  31 years old for purpose of ECG interpretation Sinus rhythm Prolonged PR interval Probable left atrial enlargement ST elev, probable normal early repol pattern No old tracing to compare Confirmed by Rogers, Doreatha Martin [phone]) on 11/25/2016 2:47:02 PM       Radiology No results found.  Procedures Procedures (including critical care time)  Medications Ordered in ED Medications - No data to display  Results for orders placed or performed during the hospital encounter of 11/25/16  Comprehensive metabolic panel  Result Value Ref Range   Sodium 139 135 - 145 mmol/L   Potassium 3.9 3.5 - 5.1 mmol/L   Chloride 106 101 - 111 mmol/L   CO2 23 22 - 32 mmol/L   Glucose, Bld 100 (H) 65 - 99 mg/dL   BUN 10 6 - 20 mg/dL   Creatinine, Ser 1.611.20 0.61 - 1.24 mg/dL   Calcium 9.5 8.9 - 09.610.3 mg/dL   Total Protein 7.6 6.5 - 8.1 g/dL     Albumin 4.2 3.5 - 5.0 g/dL   AST 38 15 - 41 U/L   ALT 26 17 - 63 U/L   Alkaline Phosphatase 108 38 - 126 U/L   Total Bilirubin 0.5 0.3 - 1.2 mg/dL   GFR calc non Af Amer >60 >60 mL/min   GFR calc Af Amer >60 >60 mL/min   Anion gap 10 5 - 15  Ethanol  Result Value Ref Range   Alcohol, Ethyl (B) <5 <5 mg/dL  Salicylate level  Result Value Ref Range   Salicylate Lvl <7.0 2.8 - 30.0 mg/dL  Acetaminophen level  Result Value Ref Range   Acetaminophen (Tylenol), Serum <10 (L) 10 - 30 ug/mL  cbc  Result Value Ref Range   WBC 5.0 4.0 - 10.5 K/uL   RBC 4.80 4.22 - 5.81 MIL/uL   Hemoglobin 14.9 13.0 - 17.0 g/dL   HCT 04.543.3 40.939.0 - 81.152.0 %   MCV 90.2 78.0 - 100.0 fL   MCH 31.0 26.0 - 34.0 pg   MCHC 34.4 30.0 - 36.0 g/dL   RDW 91.413.0 78.211.5 - 95.615.5 %   Platelets 225 150 - 400 K/uL   No results found. Initial Impression / Assessment and Plan / ED Course  I have reviewed the triage vital signs and the nursing notes.  Pertinent labs & imaging results that were available during my care of the patient were reviewed by me and considered in my medical decision making (see chart for details).     Patient is medically cleared for psychiatric evaluation Blood pressure mildly moderately elevated. Light of history of substance abuse after that if his blood pressure remains elevated in a week, he may need treatment for hypertension Final Clinical Impressions(s) / ED Diagnoses  Diagnosis #1 suicidal ideation #2 polysubstance abuse Final diagnoses:  None  #3 elevated blood pressure  New Prescriptions New Prescriptions   No medications on file     Doug SouJacubowitz, Seydou Hearns, MD 11/25/16 1451

## 2016-11-25 NOTE — ED Notes (Signed)
Pt ambulatory to F11 w/Sitter. Pt signed medical clearance pt policy form. Pt alert, oriented, cooperative. Belongings placed in locker #1 - 1 labeled belongings bag and 1 valuables envelope to security.

## 2016-11-25 NOTE — ED Notes (Signed)
Pt provided with urinal and ginger ale

## 2016-11-25 NOTE — ED Notes (Addendum)
Resting on bed w/eyes closed. Respirations even, unlabored.

## 2016-11-26 ENCOUNTER — Inpatient Hospital Stay (HOSPITAL_COMMUNITY)
Admission: AD | Admit: 2016-11-26 | Payer: Federal, State, Local not specified - Other | Source: Intra-hospital | Admitting: Psychiatry

## 2016-11-26 NOTE — Progress Notes (Signed)
Patient meets criteria for inpatient treatment. CSW faxed referrals to the following inpatient facilities for review:  [facility] [address], [address], [facility] [address], [facility], [facility], [facility], [facility], [facility].   TTS will continue to seek bed placement.  Baldo DaubJolan Hannah Strader MSW, LCSWA CSW Disposition [phone]

## 2016-11-26 NOTE — Consult Note (Signed)
Telepsych Consultation   Reason for Consult: [place], a 31 y.o. Male admited with depression and suicide ideations. Referring Physician: EDP Patient Identification: Mahkai Blake MRN:  [id number] Principal Diagnosis: <principal problem not specified> Diagnosis:  There are no active problems to display for this patient.   Total Time spent with patient: 30 minutes  Subjective:   Albert Blake is a 31 y.o. male patient admitted with depression and suicide ideations .  HPI: Per the assessment completed by Bedelia Person on 11/25/16: Albert Blake is an 31 y.o. male who came to the ED with suicidal ideations with a plan to drown himself in a pool (pt can't swim). He states that he was just in the ED on Friday (note below) seeking treatment for substance abuse and depression and was discharged with resources. He states that he tried to follow up with the residential facilites listed but by that time they were not taking any new patients and he relapsed on cocaine. He states that he felt guilty and bad about himself and thought "he might as well end it". He states that he was looking at the pool and was ready to jump but saw himself in the reflection and stopped because he didn't want to do that to his children. Pt states that he wants to be in his childrens life but addiction has prevented this from happening. He states that he has no contact with his family because of his addiction and is homeless. Pt slept in a stairwell and in a park the last two days. Pt states that he wants to get help and get clean but he feels hopeless because he has tried so many times and relapsed. Pt does not feel safe to leave and currently feels depressed and suicidal.   Assessment from 11/23/16: Albert Washingtonis an 31 y.o.malewho came to the ED seeking residential treatment for substance abuse. He states that he is "tired of using drugs and alcohol and wants help". He denies SI, HI and AVH  but states that his use makes him depressed. He states that he often does "things he isn't proud of" when he is trying to get drugs and ends up feeling guilty and depressed the next day which triggers him to use more. He states that he was in [place] for a total of 4 years for several felony breaking and entering. (He states that this was to get money to get drugs). Pt got out of [place] 2 years ago and started using again. He states that he uses alcohol, marijuana, cocaine, xanex and opiates daily (if he can get xanex and opiates). He reports that he has "burned all his bridges" and has no family support. He is currently homeless and stays at friends houses or on the streets. Pt denies any suicide attempts previously and has never had an inpatient admission or outpatient therapy.   On Exam: Patient was seen, chart reviewed, patient was in bed awake, alert and oriented x4. Patient, reiterated the reason for this hospital admission as documented above. He stated that he got scared and decided to go to the hospital. Today, patient stated that he feels much better, that his mind is cleared up a bit and he was able to speak to his girlfriend who commended his effort for his decision to check himself into the hospital. Patient denies HI/VAH, and denies currently being suicidal however, he was unable to contract for safety if he goes home. He's afraid that he will start having the same thoughts  again if discharged. Patient is willing to go inpatient for treatment.  Past Psychiatric History: See H&P  Risk to Self: Suicidal Ideation: Yes-Currently Present Suicidal Intent: Yes-Currently Present Is patient at risk for suicide?: Yes Suicidal Plan?: Yes-Currently Present Specify Current Suicidal Plan: pt had a plan to jump into a pool and drown (pt can't swim) Access to Means: Yes Specify Access to Suicidal Means: access to a pool What has been your use of drugs/alcohol within the last 12 months?: using drugs and  alcohol to excess daily How many times?: 0 Other Self Harm Risks: chronic drug use Triggers for Past Attempts: None known Intentional Self Injurious Behavior: None Risk to Others: Homicidal Ideation: No Thoughts of Harm to Others: No Comment - Thoughts of Harm to Others: No thoughts today Current Homicidal Intent: No Current Homicidal Plan: No Access to Homicidal Means: No Describe Access to Homicidal Means: no Identified Victim: none History of harm to others?: No Assessment of Violence: None Noted Violent Behavior Description: no Does patient have access to weapons?: No Criminal Charges Pending?: No Does patient have a court date: No Prior Inpatient Therapy: Prior Inpatient Therapy: No Prior Outpatient Therapy: Prior Outpatient Therapy: No Does patient have an ACCT team?: No Does patient have Intensive In-House Services?  : No Does patient have [facility] services? : No Does patient have P4[facility] services?: No  Past Medical History: History reviewed. No pertinent past medical history. History reviewed. No pertinent surgical history. Family History: History reviewed. No pertinent family history. Family Psychiatric  History: Unknown  Social History:  History  Alcohol Use  . Yes    Comment: pt states, " I drink about 2 bottles a day."     History  Drug Use  . Types: Cocaine    Comment: last use 11/22/16    Social History   Social History  . Marital status: Single    Spouse name: N/A  . Number of children: N/A  . Years of education: N/A   Social History Main Topics  . Smoking status: Current Every Day Smoker    Packs/day: 1.00    Types: Cigarettes  . Smokeless tobacco: Never Used  . Alcohol use Yes     Comment: pt states, " I drink about 2 bottles a day."  . Drug use: Yes    Types: Cocaine     Comment: last use 11/22/16  . Sexual activity: Not Asked   Other Topics Concern  . None   Social History Narrative  . None   Additional Social History:    Allergies:   No Known Allergies  Labs:  Results for orders placed or performed during the hospital encounter of 11/25/16 (from the past 48 hour(s))  Comprehensive metabolic panel     Status: Abnormal   Collection Time: 11/25/16  1:04 PM  Result Value Ref Range   Sodium 139 135 - 145 mmol/L   Potassium 3.9 3.5 - 5.1 mmol/L   Chloride 106 101 - 111 mmol/L   CO2 23 22 - 32 mmol/L   Glucose, Bld 100 (H) 65 - 99 mg/dL   BUN 10 6 - 20 mg/dL   Creatinine, Ser 1.20 0.61 - 1.24 mg/dL   Calcium 9.5 8.9 - 10.3 mg/dL   Total Protein 7.6 6.5 - 8.1 g/dL   Albumin 4.2 3.5 - 5.0 g/dL   AST 38 15 - 41 U/L   ALT 26 17 - 63 U/L   Alkaline Phosphatase 108 38 - 126 U/L   Total Bilirubin 0.5  0.3 - 1.2 mg/dL   GFR calc non Af Amer >60 >60 mL/min   GFR calc Af Amer >60 >60 mL/min    Comment: (NOTE) The eGFR has been calculated using the CKD EPI equation. This calculation has not been validated in all clinical situations. eGFR's persistently <60 mL/min signify possible Chronic Kidney Disease.    Anion gap 10 5 - 15  cbc     Status: None   Collection Time: 11/25/16  1:04 PM  Result Value Ref Range   WBC 5.0 4.0 - 10.5 K/uL   RBC 4.80 4.22 - 5.81 MIL/uL   Hemoglobin 14.9 13.0 - 17.0 g/dL   HCT 43.3 39.0 - 52.0 %   MCV 90.2 78.0 - 100.0 fL   MCH 31.0 26.0 - 34.0 pg   MCHC 34.4 30.0 - 36.0 g/dL   RDW 13.0 11.5 - 15.5 %   Platelets 225 150 - 400 K/uL  Ethanol     Status: None   Collection Time: 11/25/16  1:06 PM  Result Value Ref Range   Alcohol, Ethyl (B) <5 <5 mg/dL    Comment:        LOWEST DETECTABLE LIMIT FOR SERUM ALCOHOL IS 5 mg/dL FOR MEDICAL PURPOSES ONLY   Salicylate level     Status: None   Collection Time: 11/25/16  1:06 PM  Result Value Ref Range   Salicylate Lvl <4.1 2.8 - 30.0 mg/dL  Acetaminophen level     Status: Abnormal   Collection Time: 11/25/16  1:06 PM  Result Value Ref Range   Acetaminophen (Tylenol), Serum <10 (L) 10 - 30 ug/mL    Comment:        THERAPEUTIC CONCENTRATIONS  VARY SIGNIFICANTLY. A RANGE OF 10-30 ug/mL MAY BE AN EFFECTIVE CONCENTRATION FOR MANY PATIENTS. HOWEVER, SOME ARE BEST TREATED AT CONCENTRATIONS OUTSIDE THIS RANGE. ACETAMINOPHEN CONCENTRATIONS >150 ug/mL AT 4 HOURS AFTER INGESTION AND >50 ug/mL AT 12 HOURS AFTER INGESTION ARE OFTEN ASSOCIATED WITH TOXIC REACTIONS.   Rapid urine drug screen (hospital performed)     Status: Abnormal   Collection Time: 11/25/16  4:10 PM  Result Value Ref Range   Opiates NONE DETECTED NONE DETECTED   Cocaine POSITIVE (A) NONE DETECTED   Benzodiazepines NONE DETECTED NONE DETECTED   Amphetamines NONE DETECTED NONE DETECTED   Tetrahydrocannabinol POSITIVE (A) NONE DETECTED   Barbiturates NONE DETECTED NONE DETECTED    Comment:        DRUG SCREEN FOR MEDICAL PURPOSES ONLY.  IF CONFIRMATION IS NEEDED FOR ANY PURPOSE, NOTIFY LAB WITHIN 5 DAYS.        LOWEST DETECTABLE LIMITS FOR URINE DRUG SCREEN Drug Class       Cutoff (ng/mL) Amphetamine      1000 Barbiturate      200 Benzodiazepine   962 Tricyclics       229 Opiates          300 Cocaine          300 THC              50     Current Facility-Administered Medications  Medication Dose Route Frequency Provider Last Rate Last Dose  . acetaminophen (TYLENOL) tablet 1,000 mg  1,000 mg Oral Q6H PRN Orlie Dakin, MD       Current Outpatient Prescriptions  Medication Sig Dispense Refill  . acetaminophen (TYLENOL) 500 MG tablet Take 1,000 mg by mouth every 6 (six) hours as needed for moderate pain.      Musculoskeletal: UTA  via camera  Psychiatric Specialty Exam: Physical Exam  Nursing note and vitals reviewed.   Review of Systems  Psychiatric/Behavioral: Positive for depression, substance abuse and suicidal ideas. Negative for hallucinations and memory loss. The patient is nervous/anxious. The patient does not have insomnia.   All other systems reviewed and are negative.   Blood pressure (!) 146/81, pulse (!) 53, temperature 97.8 F  (36.6 C), temperature source Oral, resp. rate 18, SpO2 100 %.There is no height or weight on file to calculate BMI.  General Appearance: wearing hospital scrub  Eye Contact:  Good  Speech:  Clear and Coherent and Normal Rate  Volume:  Normal  Mood:  Euthymic and helpless  Affect:  Appropriate and Congruent  Thought Process:  Coherent and Goal Directed  Orientation:  Full (Time, Place, and Person)  Thought Content:  WDL and Logical  Suicidal Thoughts:  Denies but unable to contract for safety at home  Homicidal Thoughts:  No  Memory:  Immediate;   Good Recent;   Good Remote;   Fair  Judgement:  Intact  Insight:  Present  Psychomotor Activity:  Normal  Concentration:  Concentration: Good and Attention Span: Good  Recall:  Good  Fund of Knowledge:  Good  Language:  Good  Akathisia:  NA  Handed:  Right  AIMS (if indicated):     Assets:  Communication Skills Desire for Improvement Financial Resources/Insurance Housing Intimacy Leisure Time Physical Health  ADL's:  Intact  Cognition:  WNL  Sleep:        Treatment Plan Summary: Daily contact with patient to assess and evaluate symptoms and progress in treatment, Medication management and Plan inpatient admission for med management and stability  Disposition: Recommend psychiatric Inpatient admission when medically cleared. Patient unable to contract for safety and agrees to inpatient hopsitalization for treatment and stabilization.  Vicenta Aly, NP 11/26/2016 10:53 AM

## 2016-11-26 NOTE — ED Provider Notes (Signed)
I was asked to assess the patient because he was asking to leave the emergency room now. I reviewed the recent notes. Patient was here for substance abuse issues and suicidal ideation.  Reviewed the psychiatry consultation from 11:00 this morning. They indicated the patient would not contract for safety and they recommended inpatient admission .    Patient told me that he spoke to the mother of his child. She is ready to take him back. He wants to leave the hospital and go back with her. Patient states that he lied about wanting to harm himself.  I explained to the patient was bit concerned about his sudden change. Our psychiatry team felt that he needed inpatient admission.  I do not recommend discharge at this point and think he should be admitted to the hospital.  I was a minutes of explaining the patient IVC status and that I was going to place him on involuntary commitment if he would not stay.  Patient ended up walking out of the ED for security was available.  He eloped .  i have filled out IVC paperwork at this time and have it faxed to the straight.    Linwood DibblesKnapp, Tionna Gigante, MD 11/26/16 [phone]

## 2016-11-26 NOTE — ED Notes (Signed)
Meal tray given 

## 2016-11-26 NOTE — ED Notes (Signed)
Pt reports he lied about wanting to commit suicide. Pt stating he is going to leave. EDP advised pt about being IVC, pt ran out of ED. Security made aware. Pt still in burgundy scrubs.

## 2016-11-26 NOTE — ED Notes (Signed)
Pt using 2nd phone call.

## 2016-11-26 NOTE — ED Notes (Signed)
Pt requesting to go home. EDP at bedside speaking with pt about this.

## 2017-04-10 ENCOUNTER — Emergency Department (HOSPITAL_COMMUNITY)
Admission: EM | Admit: 2017-04-10 | Discharge: 2017-04-10 | Disposition: A | Discharge status: Home / Self Care | Payer: Self-pay | Attending: Emergency Medicine | Admitting: Emergency Medicine

## 2017-04-10 ENCOUNTER — Encounter (HOSPITAL_COMMUNITY): Payer: Self-pay | Admitting: Emergency Medicine

## 2017-04-10 ENCOUNTER — Other Ambulatory Visit: Payer: Self-pay

## 2017-04-10 DIAGNOSIS — T782XXA Anaphylactic shock, unspecified, initial encounter: Secondary | ICD-10-CM

## 2017-04-10 DIAGNOSIS — T7800XA Anaphylactic reaction due to unspecified food, initial encounter: Secondary | ICD-10-CM | POA: Insufficient documentation

## 2017-04-10 DIAGNOSIS — F17210 Nicotine dependence, cigarettes, uncomplicated: Secondary | ICD-10-CM | POA: Insufficient documentation

## 2017-04-10 MED ORDER — RANITIDINE HCL 150 MG PO CAPS: 150.0000 mg | ORAL_CAPSULE | Freq: Two times a day (BID) | ORAL | 0 refills | Status: DC

## 2017-04-10 MED ORDER — PREDNISONE 10 MG PO TABS: 30.0000 mg | ORAL_TABLET | Freq: Every day | ORAL | 0 refills | Status: AC

## 2017-04-10 MED ORDER — EPINEPHRINE 0.3 MG/0.3ML IJ SOAJ
0.3000 mg | Freq: Once | INTRAMUSCULAR | Status: AC
  Administered 2017-04-10: 0.3 mg via INTRAMUSCULAR

## 2017-04-10 MED ORDER — DIPHENHYDRAMINE HCL 50 MG/ML IJ SOLN
50.0000 mg | Freq: Once | INTRAMUSCULAR | Status: AC
  Administered 2017-04-10: 50 mg via INTRAVENOUS

## 2017-04-10 MED ORDER — EPINEPHRINE 0.3 MG/0.3ML IJ SOAJ
0.3000 mg | Freq: Once | INTRAMUSCULAR | Status: AC
  Administered 2017-04-10: 0.3 mg via INTRAMUSCULAR
  Filled 2017-04-10: qty 0.3

## 2017-04-10 MED ORDER — DIPHENHYDRAMINE HCL 25 MG PO CAPS: 25.0000 mg | ORAL_CAPSULE | Freq: Four times a day (QID) | ORAL | 0 refills | Status: DC

## 2017-04-10 MED ORDER — METHYLPREDNISOLONE SODIUM SUCC 125 MG IJ SOLR
125.0000 mg | Freq: Once | INTRAMUSCULAR | Status: AC
  Administered 2017-04-10: 125 mg via INTRAVENOUS

## 2017-04-10 MED ORDER — FAMOTIDINE IN NACL 20-0.9 MG/50ML-% IV SOLN
20.0000 mg | Freq: Once | INTRAVENOUS | Status: AC
  Administered 2017-04-10: 20 mg via INTRAVENOUS

## 2017-04-10 NOTE — ED Notes (Signed)
Pt given coke and graham crackers.  

## 2017-04-10 NOTE — ED Provider Notes (Signed)
[place] EMERGENCY DEPARTMENT Provider Note   CSN: [id number] Arrival date & time: 04/10/17  1705     History   Chief Complaint Chief Complaint  Patient presents with  . Allergic Reaction  . Shortness of Breath    HPI Albert Blake is a 31 y.o. male.  HPI   Presents with tongue and throat swelling. Reports ate chicken, mac and cheese, and collard greens and about 1.5 hours later or less developed shortness of breath, tongue and lip swelling. Denies rash, vomiting, diarrhea.  No history of allergic reactions. No other known exposures, bites, pets or foods. No new medications. No drugs PTA. Took promethazine last night but not taking any other medications.  History reviewed. No pertinent past medical history.  There are no active problems to display for this patient.   History reviewed. No pertinent surgical history.     Home Medications    Prior to Admission medications   Medication Sig Start Date End Date Taking? Authorizing Provider  acetaminophen (TYLENOL) 500 MG tablet Take 1,000 mg by mouth every 6 (six) hours as needed for moderate pain.    [provider]  diphenhydrAMINE (BENADRYL) 25 mg capsule Take 1 capsule (25 mg total) every 6 (six) hours for 2 days by mouth. 04/10/17 04/12/17  Gareth Morgan, MD  predniSONE (DELTASONE) 10 MG tablet Take 3 tablets (30 mg total) daily for 3 days by mouth. 04/10/17 04/13/17  Gareth Morgan, MD  ranitidine (ZANTAC) 150 MG capsule Take 1 capsule (150 mg total) 2 (two) times daily for 3 days by mouth. 04/10/17 04/13/17  Gareth Morgan, MD    Family History History reviewed. No pertinent family history.  Social History Social History   Tobacco Use  . Smoking status: Current Every Day Smoker    Packs/day: 1.00    Types: Cigarettes  . Smokeless tobacco: Never Used  Substance Use Topics  . Alcohol use: Yes    Comment: pt states, " I drink about 2 bottles a day."  . Drug use: Yes   Types: Cocaine    Comment: last use 11/22/16     Allergies   Patient has no known allergies.   Review of Systems Review of Systems  Constitutional: Negative for fever.  HENT: Positive for trouble swallowing. Negative for sore throat and voice change.   Eyes: Negative for visual disturbance.  Respiratory: Positive for shortness of breath.   Cardiovascular: Negative for chest pain.  Gastrointestinal: Negative for abdominal pain.  Genitourinary: Negative for difficulty urinating.  Musculoskeletal: Negative for back pain and neck stiffness.  Skin: Negative for rash.  Neurological: Negative for syncope and headaches.     Physical Exam Updated Vital Signs BP (!) 141/87   Pulse (!) 51   Temp 98.7 F (37.1 C) (Oral)   Resp (!) 27   Ht 6' 2"  (1.88 m)   Wt 90.7 kg (200 lb)   SpO2 99%   BMI 25.68 kg/m   Physical Exam  Constitutional: He is oriented to person, place, and time. He appears well-developed and well-nourished. No distress.  HENT:  Head: Normocephalic and atraumatic.  Tongue swelling, difficult to visualize posterior pharynx, visualized portion appears to have swelling  Eyes: Conjunctivae and EOM are normal.  Neck: Normal range of motion.  Cardiovascular: Normal rate, regular rhythm, normal heart sounds and intact distal pulses. Exam reveals no gallop and no friction rub.  No murmur heard. Pulmonary/Chest: Effort normal and breath sounds normal. No stridor. No respiratory distress. He has no  wheezes. He has no rales.  Abdominal: Soft. He exhibits no distension. There is no tenderness. There is no guarding.  Musculoskeletal: He exhibits no edema.  Neurological: He is alert and oriented to person, place, and time.  Skin: Skin is warm and dry. He is not diaphoretic.  Nursing note and vitals reviewed.    ED Treatments / Results  Labs (all labs ordered are listed, but only abnormal results are displayed) Labs Reviewed - No data to display  EKG  EKG  Interpretation None       Radiology No results found.  Procedures .Critical Care Performed by: Gareth Morgan, MD Authorized by: Gareth Morgan, MD   Comments:     CRITICAL CARE: anaphylaxis Performed by: Tennis Must   Total critical care time: 30 minutes  Critical care time was exclusive of separately billable procedures and treating other patients.  Critical care was necessary to treat or prevent imminent or life-threatening deterioration.  Critical care was time spent personally by me on the following activities: development of treatment plan with patient and/or surrogate as well as nursing, discussions with consultants, evaluation of patient's response to treatment, examination of patient, obtaining history from patient or surrogate, ordering and performing treatments and interventions, ordering and review of laboratory studies, ordering and review of radiographic studies, pulse oximetry and re-evaluation of patient's condition.    (including critical care time)  Medications Ordered in ED Medications  EPINEPHrine (EPI-PEN) injection 0.3 mg (0.3 mg Intramuscular Given 04/10/17 1724)  famotidine (PEPCID) IVPB 20 mg premix (0 mg Intravenous Stopped 04/10/17 1820)  diphenhydrAMINE (BENADRYL) injection 50 mg (50 mg Intravenous Given 04/10/17 1726)  methylPREDNISolone sodium succinate (SOLU-MEDROL) 125 mg/2 mL injection 125 mg (125 mg Intravenous Given 04/10/17 1726)  EPINEPHrine (EPI-PEN) injection 0.3 mg (0.3 mg Intramuscular Provided for home use 04/10/17 2154)     Initial Impression / Assessment and Plan / ED Course  I have reviewed the triage vital signs and the nursing notes.  Pertinent labs & imaging results that were available during my care of the patient were reviewed by me and considered in my medical decision making (see chart for details).     31yo male presents with dyspnea, tongue and throat swelling after eating today.  History concerning for  anaphylaxis/allergic reaction. Given IM epinephrine, benadryl, solumedrol, H2 blocker with resolution of symptoms. Observed for over 4 hours after epi without rebound of symptoms.  Unclear trigger, discussed need for allergy follow up, continued monitoring for triggers, avoidance of food he ate prior to this event.  Gave rx for benadryl, ranitidine, prednisone and gave epipen and discussed use. Patient discharged in stable condition with understanding of reasons to return.   Final Clinical Impressions(s) / ED Diagnoses   Final diagnoses:  Anaphylaxis, initial encounter    ED Discharge Orders        Ordered    predniSONE (DELTASONE) 10 MG tablet  Daily     04/10/17 2147    ranitidine (ZANTAC) 150 MG capsule  2 times daily     04/10/17 2147    diphenhydrAMINE (BENADRYL) 25 mg capsule  Every 6 hours     04/10/17 2147       Gareth Morgan, MD 04/11/17 1149

## 2017-04-10 NOTE — ED Triage Notes (Signed)
Pt having trouble breathing/ feeling like his throat is closing up. Pt having allergic reaction to something he ate?

## 2019-05-03 ENCOUNTER — Encounter (HOSPITAL_COMMUNITY): Payer: Self-pay | Admitting: Emergency Medicine

## 2019-05-03 ENCOUNTER — Other Ambulatory Visit: Payer: Self-pay

## 2019-05-03 ENCOUNTER — Emergency Department (HOSPITAL_COMMUNITY)
Admission: EM | Admit: 2019-05-03 | Discharge: 2019-05-03 | Disposition: A | Discharge status: Home / Self Care | Payer: Self-pay | Attending: Emergency Medicine | Admitting: Emergency Medicine

## 2019-05-03 DIAGNOSIS — Z20828 Contact with and (suspected) exposure to other viral communicable diseases: Secondary | ICD-10-CM | POA: Insufficient documentation

## 2019-05-03 DIAGNOSIS — J029 Acute pharyngitis, unspecified: Secondary | ICD-10-CM

## 2019-05-03 DIAGNOSIS — Z79899 Other long term (current) drug therapy: Secondary | ICD-10-CM | POA: Insufficient documentation

## 2019-05-03 DIAGNOSIS — I10 Essential (primary) hypertension: Secondary | ICD-10-CM

## 2019-05-03 DIAGNOSIS — F17210 Nicotine dependence, cigarettes, uncomplicated: Secondary | ICD-10-CM | POA: Insufficient documentation

## 2019-05-03 LAB — POC SARS CORONAVIRUS 2 AG -  ED: SARS Coronavirus 2 Ag: NEGATIVE

## 2019-05-03 LAB — GROUP A STREP BY PCR: Group A Strep by PCR: NOT DETECTED

## 2019-05-03 MED ORDER — DEXAMETHASONE SODIUM PHOSPHATE 10 MG/ML IJ SOLN
10.0000 mg | Freq: Once | INTRAMUSCULAR | Status: AC
  Administered 2019-05-03: 14:00:00 10 mg via INTRAMUSCULAR
  Filled 2019-05-03: qty 1

## 2019-05-03 MED ORDER — AMLODIPINE BESYLATE 5 MG PO TABS: 5.0000 mg | ORAL_TABLET | Freq: Every day | ORAL | 0 refills | Status: DC

## 2019-05-03 MED ORDER — ACETAMINOPHEN 325 MG PO TABS
650.0000 mg | ORAL_TABLET | Freq: Once | ORAL | Status: AC | PRN
  Administered 2019-05-03: 650 mg via ORAL
  Filled 2019-05-03: qty 2

## 2019-05-03 MED ORDER — PENICILLIN G BENZATHINE 1200000 UNIT/2ML IM SUSP: 1.2000 10*6.[IU] | Freq: Once | INTRAMUSCULAR | Status: DC

## 2019-05-03 NOTE — ED Notes (Signed)
Pt. Requesting food Albert Blake .  Given crackers and dark soda

## 2019-05-03 NOTE — ED Notes (Signed)
Caregiver Lanae Crumbly to be updated at number [phone].

## 2019-05-03 NOTE — ED Triage Notes (Signed)
C/o sore throat since this morning.

## 2019-05-03 NOTE — Discharge Instructions (Addendum)
Start taking amlodipine once daily for your blood pressure.  You can purchase a blood pressure cuff online or at a pharmacy.  I would recommend rechecking your blood pressures daily 1 hour after taking your medications.  Do not check multiple times daily.  Please follow-up with your primary care provider soon as possible for reevaluation of your high blood pressure.  With regards to your sore throat, you can take 1 to 2 tablets of Tylenol (350mg -1000mg  depending on the dose) every 6 hours as needed for pain/fever.  Do not exceed 4000 mg of Tylenol daily.  If your pain persists you can take a dose of ibuprofen in between doses of Tylenol.  I usually recommend 400 to 600 mg of ibuprofen every 6 hours.  Take this with food to avoid upset stomach issues.  Drink plenty of fluids and get plenty of rest.  Use warm water salt gargles, warm teas, honey, Chloraseptic spray as needed for sore throat.  Return to the emergency department if any concerning signs or symptoms develop such as persistent vomiting, severe chest pains, shortness of breath, drooling, throat tightness, difficulty breathing or swallowing.

## 2019-05-03 NOTE — ED Provider Notes (Signed)
[facility] EMERGENCY DEPARTMENT Provider Note   CSN: [id number] Arrival date & time: 05/03/19  1249     History   Chief Complaint Chief Complaint  Patient presents with  . Sore Throat    HPI Albert Blake is a 33 y.o. male presents today for evaluation of acute onset, progressively worsening sore throat for 2 days.  Also reports subjective fevers and chills, pain with swallowing.  Denies drooling or throat tightness.  No facial swelling.  Denies shortness of breath, cough, nasal congestion, abdominal pain, nausea, or vomiting.  No known sick contacts.  He has not tried anything for his symptoms.  States that he thinks he has strep throat.  He is hypertensive in the ED today, states he has no known history of hypertension but thinks that his blood pressure is elevated due to moving around too much before his blood pressure was checked.     The history is provided by the patient.    History reviewed. No pertinent past medical history.  There are no active problems to display for this patient.   History reviewed. No pertinent surgical history.      Home Medications    Prior to Admission medications   Medication Sig Start Date End Date Taking? Authorizing Provider  acetaminophen (TYLENOL) 500 MG tablet Take 1,000 mg by mouth every 6 (six) hours as needed for moderate pain.    [provider]  amLODipine (NORVASC) 5 MG tablet Take 1 tablet (5 mg total) by mouth daily. 05/03/19   Luevenia Maxin, Aysiah Jurado A, PA-C  diphenhydrAMINE (BENADRYL) 25 mg capsule Take 1 capsule (25 mg total) every 6 (six) hours for 2 days by mouth. 04/10/17 04/12/17  Alvira Monday, MD  ranitidine (ZANTAC) 150 MG capsule Take 1 capsule (150 mg total) 2 (two) times daily for 3 days by mouth. 04/10/17 04/13/17  Alvira Monday, MD    Family History No family history on file.  Social History Social History   Tobacco Use  . Smoking status: Current Every Day Smoker    Packs/day:  1.00    Types: Cigarettes  . Smokeless tobacco: Never Used  Substance Use Topics  . Alcohol use: Yes    Comment: pt states, " I drink about 2 bottles a day."  . Drug use: Yes    Types: Cocaine, Marijuana     Allergies   Patient has no known allergies.   Review of Systems Review of Systems  Constitutional: Positive for chills and fever.  HENT: Positive for sore throat. Negative for drooling.   Respiratory: Negative for cough and shortness of breath.   Cardiovascular: Negative for chest pain.  Gastrointestinal: Negative for abdominal pain, nausea and vomiting.  All other systems reviewed and are negative.    Physical Exam Updated Vital Signs BP (!) 190/131 (BP Location: Left Arm)   Pulse (!) 109   Temp (!) 100.6 F (38.1 C) (Oral)   Resp 18   SpO2 100%   Physical Exam Vitals signs and nursing note reviewed.  Constitutional:      General: He is not in acute distress.    Appearance: He is well-developed.  HENT:     Head: Normocephalic and atraumatic.     Mouth/Throat:     Mouth: Mucous membranes are moist.     Pharynx: Oropharyngeal exudate and posterior oropharyngeal erythema present. No uvula swelling.     Tonsils: Tonsillar exudate present. 3+ on the right. 3+ on the left.     Comments: 3+ tonsillar  hypertrophy bilaterally with white patchy exudates.  Uvula is midline.  No trismus or sublingual abnormalities.  Tolerating secretions without difficulty. Eyes:     General:        Right eye: No discharge.        Left eye: No discharge.     Conjunctiva/sclera: Conjunctivae normal.  Neck:     Musculoskeletal: Normal range of motion and neck supple. No neck rigidity.     Vascular: No JVD.     Trachea: No tracheal deviation.     Comments: Bilateral anterior cervical lymphadenopathy. Cardiovascular:     Rate and Rhythm: Normal rate.  Pulmonary:     Effort: Pulmonary effort is normal.     Comments: Speaking in full sentences without difficulty. Abdominal:      General: There is no distension.     Tenderness: There is no abdominal tenderness. There is no guarding.  Lymphadenopathy:     Cervical: Cervical adenopathy present.  Skin:    General: Skin is warm and dry.     Findings: No erythema.  Neurological:     Mental Status: He is alert.  Psychiatric:        Behavior: Behavior normal.      ED Treatments / Results  Labs (all labs ordered are listed, but only abnormal results are displayed) Labs Reviewed  GROUP A STREP BY PCR  POC SARS CORONAVIRUS 2 AG -  ED    EKG None  Radiology No results found.  Procedures Procedures (including critical care time)  Medications Ordered in ED Medications  penicillin g benzathine (BICILLIN LA) 1200000 UNIT/2ML injection 1.2 Million Units (has no administration in time range)  acetaminophen (TYLENOL) tablet 650 mg (650 mg Oral Given 05/03/19 1312)  dexamethasone (DECADRON) injection 10 mg (10 mg Intramuscular Given 05/03/19 1408)     Initial Impression / Assessment and Plan / ED Course  I have reviewed the triage vital signs and the nursing notes.  Pertinent labs & imaging results that were available during my care of the patient were reviewed by me and considered in my medical decision making (see chart for details).        Albert Blake was evaluated in Emergency Department on 05/03/2019 for the symptoms described in the history of present illness. He was evaluated in the context of the global COVID-19 pandemic, which necessitated consideration that the patient might be at risk for infection with the SARS-CoV-2 virus that causes COVID-19. Institutional protocols and algorithms that pertain to the evaluation of patients at risk for COVID-19 are in a state of rapid change based on information released by regulatory bodies including the CDC and federal and state organizations. These policies and algorithms were followed during the patient's care in the ED.  Patient presenting for evaluation  of fever and sore throat.  He is mildly febrile and tachycardic on initial evaluation but markedly hypertensive.  He has no history of hypertension.  He denies any chest pain, urinary symptoms, severe headaches, shortness of breath.  No difficulty breathing or swallowing.  He is tolerating secretions without difficulty.  No evidence of angioedema, peritonsillar abscess, retropharyngeal abscess, or deep space neck infection.  No signs of respiratory distress.  He was given Decadron and Tylenol while waiting for test results.    His strep test and point-of-care Covid testing are negative.  However, with fever, lymphadenopathy, exudative pharyngitis, and absence of cough I think it is reasonable to treat with a dose of penicillin LA for strep.  On reevaluation  patient resting comfortably no apparent distress.  He is tolerating p.o. fluids and crackers without difficulty.  No signs of airway compromise.  No abnormal phonation.  I rechecked his blood pressure which was 177/126 on my evaluation, mild improvement from presenting blood pressure.  Discussed with Dr. Dalene SeltzerSchlossman, will start the patient on amlodipine 5 mg and recommend follow-up with PCP for reevaluation of hypertension and medication management.  No signs of endorgan damage in the absence of chest pain, urinary symptoms or abdominal pain, headaches or neurologic complaints.  3:15 PM Went to reassess patient but he and his guardian were no longer in the room.  He was marked as eloped from the ED by nursing staff however I was not informed that the patient had walked out prior to my reassessment.  I was unable to inform him of our plan to start him on amlodipine or give him penicillin for possible strep throat.  Final Clinical Impressions(s) / ED Diagnoses   Final diagnoses:  Acute pharyngitis, unspecified etiology  Hypertension, unspecified type    ED Discharge Orders         Ordered    amLODipine (NORVASC) 5 MG tablet  Daily     05/03/19 1515            Jeanie SewerFawze, Wilene Pharo A, PA-C 05/03/19 1519    Alvira MondaySchlossman, Erin, MD 05/04/19 1335

## 2019-05-13 MED FILL — AMLODIPINE BESYLATE 10 MG T: 10 | 30 days supply | Qty: 30 | Fill #0

## 2019-05-13 MED FILL — TAMSULOSIN HCL 0.4 MG CAP: 0.4 | 30 days supply | Qty: 30 | Fill #0

## 2019-05-13 MED FILL — hydrALAZINE HCL 25 MG TABS: 25 | 30 days supply | Qty: 270 | Fill #0

## 2019-07-09 ENCOUNTER — Other Ambulatory Visit: Payer: Self-pay

## 2019-07-09 ENCOUNTER — Encounter (HOSPITAL_COMMUNITY): Payer: Self-pay | Admitting: Emergency Medicine

## 2019-07-09 ENCOUNTER — Observation Stay (HOSPITAL_COMMUNITY)
Admission: EM | Admit: 2019-07-09 | Discharge: 2019-07-11 | Disposition: A | Discharge status: Home / Self Care | Payer: Self-pay | Attending: Internal Medicine | Admitting: Internal Medicine

## 2019-07-09 DIAGNOSIS — R748 Abnormal levels of other serum enzymes: Secondary | ICD-10-CM | POA: Insufficient documentation

## 2019-07-09 DIAGNOSIS — F17210 Nicotine dependence, cigarettes, uncomplicated: Secondary | ICD-10-CM | POA: Insufficient documentation

## 2019-07-09 DIAGNOSIS — K759 Inflammatory liver disease, unspecified: Secondary | ICD-10-CM

## 2019-07-09 DIAGNOSIS — I10 Essential (primary) hypertension: Secondary | ICD-10-CM | POA: Insufficient documentation

## 2019-07-09 DIAGNOSIS — F1510 Other stimulant abuse, uncomplicated: Secondary | ICD-10-CM | POA: Insufficient documentation

## 2019-07-09 DIAGNOSIS — F1910 Other psychoactive substance abuse, uncomplicated: Secondary | ICD-10-CM | POA: Insufficient documentation

## 2019-07-09 DIAGNOSIS — M6282 Rhabdomyolysis: Secondary | ICD-10-CM | POA: Insufficient documentation

## 2019-07-09 DIAGNOSIS — Z20822 Contact with and (suspected) exposure to covid-19: Secondary | ICD-10-CM | POA: Insufficient documentation

## 2019-07-09 DIAGNOSIS — B179 Acute viral hepatitis, unspecified: Principal | ICD-10-CM | POA: Insufficient documentation

## 2019-07-09 DIAGNOSIS — R7989 Other specified abnormal findings of blood chemistry: Secondary | ICD-10-CM | POA: Insufficient documentation

## 2019-07-09 DIAGNOSIS — R945 Abnormal results of liver function studies: Secondary | ICD-10-CM | POA: Diagnosis present

## 2019-07-09 DIAGNOSIS — F1410 Cocaine abuse, uncomplicated: Secondary | ICD-10-CM | POA: Insufficient documentation

## 2019-07-09 DIAGNOSIS — F1210 Cannabis abuse, uncomplicated: Secondary | ICD-10-CM | POA: Insufficient documentation

## 2019-07-09 NOTE — ED Triage Notes (Signed)
Patient reports concentrated "dark orange" urine this week , denies dysuria or hematuria . No fever or chills .

## 2019-07-10 ENCOUNTER — Encounter (HOSPITAL_COMMUNITY): Payer: Self-pay | Admitting: Internal Medicine

## 2019-07-10 ENCOUNTER — Observation Stay (HOSPITAL_COMMUNITY): Payer: Self-pay

## 2019-07-10 DIAGNOSIS — R945 Abnormal results of liver function studies: Secondary | ICD-10-CM | POA: Diagnosis present

## 2019-07-10 DIAGNOSIS — B179 Acute viral hepatitis, unspecified: Secondary | ICD-10-CM

## 2019-07-10 DIAGNOSIS — M6282 Rhabdomyolysis: Secondary | ICD-10-CM | POA: Diagnosis present

## 2019-07-10 HISTORY — DX: Acute viral hepatitis, unspecified: B17.9

## 2019-07-10 LAB — CBC WITH DIFFERENTIAL/PLATELET
Abs Immature Granulocytes: 0.01 10*3/uL (ref 0.00–0.07)
Basophils Absolute: 0.1 10*3/uL (ref 0.0–0.1)
Basophils Relative: 1 %
Eosinophils Absolute: 0.1 10*3/uL (ref 0.0–0.5)
Eosinophils Relative: 1 %
HCT: 38.6 % — ABNORMAL LOW (ref 39.0–52.0)
Hemoglobin: 13.2 g/dL (ref 13.0–17.0)
Immature Granulocytes: 0 %
Lymphocytes Relative: 23 %
Lymphs Abs: 1.5 10*3/uL (ref 0.7–4.0)
MCH: 29.5 pg (ref 26.0–34.0)
MCHC: 34.2 g/dL (ref 30.0–36.0)
MCV: 86.2 fL (ref 80.0–100.0)
Monocytes Absolute: 1.1 10*3/uL — ABNORMAL HIGH (ref 0.1–1.0)
Monocytes Relative: 18 %
Neutro Abs: 3.6 10*3/uL (ref 1.7–7.7)
Neutrophils Relative %: 57 %
Platelets: 249 10*3/uL (ref 150–400)
RBC: 4.48 MIL/uL (ref 4.22–5.81)
RDW: 15.3 % (ref 11.5–15.5)
WBC: 6.3 10*3/uL (ref 4.0–10.5)
nRBC: 0 % (ref 0.0–0.2)

## 2019-07-10 LAB — URINALYSIS, ROUTINE W REFLEX MICROSCOPIC
Glucose, UA: NEGATIVE mg/dL
Ketones, ur: 5 mg/dL — AB
Leukocytes,Ua: NEGATIVE
Nitrite: NEGATIVE
Protein, ur: >=300 mg/dL — AB
Specific Gravity, Urine: 1.029 (ref 1.005–1.030)
pH: 5 (ref 5.0–8.0)

## 2019-07-10 LAB — COMPREHENSIVE METABOLIC PANEL
ALT: 2801 U/L — ABNORMAL HIGH (ref 0–44)
AST: 2380 U/L — ABNORMAL HIGH (ref 15–41)
Albumin: 3.9 g/dL (ref 3.5–5.0)
Alkaline Phosphatase: 232 U/L — ABNORMAL HIGH (ref 38–126)
Anion gap: 12 (ref 5–15)
BUN: 12 mg/dL (ref 6–20)
CO2: 23 mmol/L (ref 22–32)
Calcium: 9.5 mg/dL (ref 8.9–10.3)
Chloride: 99 mmol/L (ref 98–111)
Creatinine, Ser: 1.2 mg/dL (ref 0.61–1.24)
GFR calc Af Amer: >60 mL/min (ref >60)
GFR calc non Af Amer: >60 mL/min (ref >60)
Glucose, Bld: 96 mg/dL (ref 70–99)
Potassium: 3.5 mmol/L (ref 3.5–5.1)
Sodium: 134 mmol/L — ABNORMAL LOW (ref 135–145)
Total Bilirubin: 12.5 mg/dL — ABNORMAL HIGH (ref 0.3–1.2)
Total Protein: 8.2 g/dL — ABNORMAL HIGH (ref 6.5–8.1)

## 2019-07-10 LAB — RAPID URINE DRUG SCREEN, HOSP PERFORMED
Amphetamines: POSITIVE — AB
Barbiturates: NOT DETECTED
Benzodiazepines: NOT DETECTED
Cocaine: POSITIVE — AB
Opiates: NOT DETECTED
Tetrahydrocannabinol: POSITIVE — AB

## 2019-07-10 LAB — ETHANOL: Alcohol, Ethyl (B): <10 mg/dL (ref <10)

## 2019-07-10 LAB — APTT: aPTT: 32 seconds (ref 24–36)

## 2019-07-10 LAB — ACETAMINOPHEN LEVEL: Acetaminophen (Tylenol), Serum: <10 ug/mL — ABNORMAL LOW (ref 10–30)

## 2019-07-10 LAB — HIV ANTIBODY (ROUTINE TESTING W REFLEX): HIV Screen 4th Generation wRfx: NONREACTIVE

## 2019-07-10 LAB — CK: Total CK: 1188 U/L — ABNORMAL HIGH (ref 49–397)

## 2019-07-10 LAB — SARS CORONAVIRUS 2 (TAT 6-24 HRS): SARS Coronavirus 2: NEGATIVE

## 2019-07-10 LAB — PROTIME-INR
INR: 1.4 — ABNORMAL HIGH (ref 0.8–1.2)
Prothrombin Time: 16.7 seconds — ABNORMAL HIGH (ref 11.4–15.2)

## 2019-07-10 LAB — TSH: TSH: 0.347 u[IU]/mL — ABNORMAL LOW (ref 0.350–4.500)

## 2019-07-10 MED ORDER — SODIUM CHLORIDE 0.9 % IV BOLUS
500.0000 mL | Freq: Once | INTRAVENOUS | Status: AC
  Administered 2019-07-10: 04:00:00 500 mL via INTRAVENOUS

## 2019-07-10 MED ORDER — HYDRALAZINE HCL 20 MG/ML IJ SOLN: 10.0000 mg | Freq: Four times a day (QID) | INTRAMUSCULAR | Status: DC | PRN

## 2019-07-10 MED ORDER — SODIUM CHLORIDE 0.9 % IV SOLN
INTRAVENOUS | Status: DC
  Administered 2019-07-10: 1000 mL via INTRAVENOUS

## 2019-07-10 MED ORDER — HYDRALAZINE HCL 25 MG PO TABS
25.0000 mg | ORAL_TABLET | Freq: Three times a day (TID) | ORAL | Status: DC
  Administered 2019-07-10 (×3): 25 mg via ORAL
  Filled 2019-07-10 (×3): qty 1

## 2019-07-10 MED ORDER — AMLODIPINE BESYLATE 5 MG PO TABS
5.0000 mg | ORAL_TABLET | Freq: Every day | ORAL | Status: DC
  Administered 2019-07-10: 5 mg via ORAL
  Filled 2019-07-10: qty 1

## 2019-07-10 MED ORDER — ALPRAZOLAM 0.5 MG PO TABS
0.5000 mg | ORAL_TABLET | Freq: Once | ORAL | Status: AC
  Administered 2019-07-10: 0.5 mg via ORAL
  Filled 2019-07-10: qty 1

## 2019-07-10 MED ORDER — SODIUM CHLORIDE 0.9 % IV BOLUS
1000.0000 mL | Freq: Once | INTRAVENOUS | Status: AC
  Administered 2019-07-10: 06:00:00 1000 mL via INTRAVENOUS

## 2019-07-10 NOTE — Progress Notes (Signed)
Received report from [place], in ED

## 2019-07-10 NOTE — Progress Notes (Signed)
Patient ID: Albert Blake, male   DOB: [birth year], 34 y.o.   MRN: [id number] Patient was admitted early this morning for abnormal liver function.  GI has been consulted.  Patient seen and examined at bedside and plan of care discussed with him.  I have reviewed his medical records including this morning CMP, vitals, labs and medications myself.  Follow right upper quadrant ultrasound and hepatitis panel.  Repeat a.m. labs including LFTs.

## 2019-07-10 NOTE — ED Provider Notes (Signed)
[facility] EMERGENCY DEPARTMENT Provider Note   CSN: [id number] Arrival date & time: 07/09/19  2338     History Chief Complaint  Patient presents with  . Concentrated Urine    Albert Blake is a 34 y.o. male with a hx of no major medical problems presents to the Emergency Department complaining of gradual, persistent, progressively worsening dark color in his urine.  He reports has been ongoing "for a while."  Patient reports associated night sweats for the past couple of months.  He has had a 40 pound weight loss in the last year without trying.  Patient denies regular alcohol usage.  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  He denies any previous problems with his liver.  He denies abdominal pain, nausea, vomiting, decreased appetite, early satiety, dysuria, changes in bowel.  No aggravating or alleviating factors.  Patient reports he thought it was because he was drinking a lot of soda but color change in his urine persisted after decreasing his soda intake.  Patient reports he has not been evaluated for this before.  He reports he has not noticed the change in color in his eyes.  Patient denies large volume of acetaminophen usage.  The history is provided by the patient and medical records. No language interpreter was used.       History reviewed. No pertinent past medical history.  There are no problems to display for this patient.   History reviewed. No pertinent surgical history.     No family history on file.  Social History   Tobacco Use  . Smoking status: Current Every Day Smoker    Packs/day: 1.00    Types: Cigarettes  . Smokeless tobacco: Never Used  Substance Use Topics  . Alcohol use: Yes    Comment: pt states, " I drink about 2 bottles a day."  . Drug use: Yes    Types: Cocaine, Marijuana    Home Medications Prior to Admission medications   Medication Sig Start Date End Date Taking? Authorizing  Provider  acetaminophen (TYLENOL) 500 MG tablet Take 1,000 mg by mouth every 6 (six) hours as needed for moderate pain.    [provider]  amLODipine (NORVASC) 5 MG tablet Take 1 tablet (5 mg total) by mouth daily. 05/03/19   Nils Flack, Mina A, PA-C  diphenhydrAMINE (BENADRYL) 25 mg capsule Take 1 capsule (25 mg total) every 6 (six) hours for 2 days by mouth. 04/10/17 04/12/17  Gareth Morgan, MD  ranitidine (ZANTAC) 150 MG capsule Take 1 capsule (150 mg total) 2 (two) times daily for 3 days by mouth. 04/10/17 04/13/17  Gareth Morgan, MD    Allergies    Patient has no known allergies.  Review of Systems   Review of Systems  Constitutional: Negative for appetite change, diaphoresis, fatigue, fever and unexpected weight change.  HENT: Negative for mouth sores.   Eyes: Negative for visual disturbance.  Respiratory: Negative for cough, chest tightness, shortness of breath and wheezing.   Cardiovascular: Negative for chest pain.  Gastrointestinal: Negative for abdominal pain, constipation, diarrhea, nausea and vomiting.  Endocrine: Negative for polydipsia, polyphagia and polyuria.  Genitourinary: Negative for dysuria, frequency, hematuria and urgency.       Discolored urine  Musculoskeletal: Negative for back pain and neck stiffness.  Skin: Negative for rash.  Allergic/Immunologic: Negative for immunocompromised state.  Neurological: Negative for syncope, light-headedness and headaches.  Hematological: Does not bruise/bleed easily.  Psychiatric/Behavioral: Negative for sleep disturbance. The patient  is not nervous/anxious.     Physical Exam Updated Vital Signs BP (!) 151/106   Pulse 100   Temp 98 F (36.7 C) (Oral)   Resp 16   SpO2 98%   Physical Exam Vitals and nursing note reviewed.  Constitutional:      General: He is not in acute distress.    Appearance: He is not diaphoretic.  HENT:     Head: Normocephalic.  Eyes:     General: Scleral icterus present.      Conjunctiva/sclera: Conjunctivae normal.  Cardiovascular:     Rate and Rhythm: Normal rate and regular rhythm.     Pulses: Normal pulses.          Radial pulses are 2+ on the right side and 2+ on the left side.  Pulmonary:     Effort: No tachypnea, accessory muscle usage, prolonged expiration, respiratory distress or retractions.     Breath sounds: No stridor.     Comments: Equal chest rise. No increased work of breathing. Abdominal:     General: There is no distension.     Palpations: Abdomen is soft. There is hepatomegaly.     Tenderness: There is no abdominal tenderness. There is no guarding or rebound.  Musculoskeletal:     Cervical back: Normal range of motion.     Comments: Moves all extremities equally and without difficulty.  Skin:    General: Skin is warm and dry.     Capillary Refill: Capillary refill takes less than 2 seconds.  Neurological:     Mental Status: He is alert.     GCS: GCS eye subscore is 4. GCS verbal subscore is 5. GCS motor subscore is 6.     Comments: Speech is clear and goal oriented.  Psychiatric:        Mood and Affect: Mood normal.     ED Results / Procedures / Treatments   Labs (all labs ordered are listed, but only abnormal results are displayed) Labs Reviewed  URINALYSIS, ROUTINE W REFLEX MICROSCOPIC - Abnormal; Notable for the following components:      Result Value   Color, Urine AMBER (*)    Hgb urine dipstick MODERATE (*)    Bilirubin Urine MODERATE (*)    Ketones, ur 5 (*)    Protein, ur >=300 (*)    Bacteria, UA RARE (*)    All other components within normal limits  CBC WITH DIFFERENTIAL/PLATELET - Abnormal; Notable for the following components:   HCT 38.6 (*)    Monocytes Absolute 1.1 (*)    All other components within normal limits  COMPREHENSIVE METABOLIC PANEL - Abnormal; Notable for the following components:   Sodium 134 (*)    Total Protein 8.2 (*)    AST 2,380 (*)    ALT 2,801 (*)    Alkaline Phosphatase 232 (*)     Total Bilirubin 12.5 (*)    All other components within normal limits  CK - Abnormal; Notable for the following components:   Total CK 1,188 (*)    All other components within normal limits  ACETAMINOPHEN LEVEL - Abnormal; Notable for the following components:   Acetaminophen (Tylenol), Serum <10 (*)    All other components within normal limits  SARS CORONAVIRUS 2 (TAT 6-24 HRS)  ETHANOL  HEPATITIS PANEL, ACUTE  RAPID URINE DRUG SCREEN, HOSP PERFORMED  PROTIME-INR  APTT    EKG None  Radiology No results found.  Procedures Procedures (including critical care time)  Medications Ordered in ED  Medications  sodium chloride 0.9 % bolus 500 mL (0 mLs Intravenous Stopped 07/10/19 0438)  sodium chloride 0.9 % bolus 1,000 mL (1,000 mLs Intravenous New Bag/Given 07/10/19 0608)    ED Course  I have reviewed the triage vital signs and the nursing notes.  Pertinent labs & imaging results that were available during my care of the patient were reviewed by me and considered in my medical decision making (see chart for details).  Clinical Course as of Jul 09 614  Fri Jul 10, 2019  8338 Discussed with Dr. Benson Norway who recommended admission for initial work-up.  Will admit to hospitalist and Dr. Benson Norway will consult.   [HM]  0602 Discussed with Dr. Maudie Mercury who will admit.   [HM]    Clinical Course User Index [HM] Elexa Kivi, Gwenlyn Perking   MDM Rules/Calculators/A&P                      Patient presents with complaints of dark-colored urine.  Urinalysis shows moderate hemoglobin, bilirubin and protein.  Elevated serum CK however normal serum creatinine.  Significantly elevated AST/ALT with elevation in alk phos and total bili.  Abdomen soft and nontender with minimal hepatomegaly.  Patient denies excessive acetaminophen usage and acetaminophen level is less than 10.  He does admit to IV drug use and intermittent alcohol use.  Ethanol level less than 10.  Patient without evidence of hepatic  encephalopathy.  Discussed with gastroenterology who recommends inpatient work-up.  Patient will be admitted to the hospitalist team.  The patient was discussed with and seen by Dr. Christy Gentles who agrees with the treatment plan.    Final Clinical Impression(s) / ED Diagnoses Final diagnoses:  Hepatitis    Rx / DC Orders ED Discharge Orders    None       Alonda Weaber, Gwenlyn Perking 07/10/19 0617    Ripley Fraise, MD 07/10/19 [phone]

## 2019-07-10 NOTE — Progress Notes (Signed)
Pt resting at this time, no verbal complaints

## 2019-07-10 NOTE — Progress Notes (Signed)
Pt alert and oriented x4, no complaints of pain or discomfort.  Bed in low position, call bell within reach.  Bed alarms on and functioning.  Assessment done and charted.  Will continue to monitor and do hourly rounding throughout the shift 

## 2019-07-10 NOTE — H&P (Addendum)
TRH H&P    Patient Demographics:    Albert Blake, is a 34 y.o. male  MRN: [id number]  DOB - Feb 01, 1986  Admit Date - 07/09/2019  Referring MD/NP/PA:  Orvil Feil Muthersbaugh  Outpatient Primary MD for the patient is Patient, No Pcp Per  Patient coming from:  home  Chief complaint- abnormal liver function   HPI:    Albert Blake  is a 34 y.o. male,  w tobacco dependence presents with dark urine.  x4 weeks. Bilateral leg aches starting yesterday, which he attributes to walking too much. Pt denies recent etoh use, pt admits to 6 beers about 3 days ago.  No recent tatoo. No tylenol or other ingestion.  Pt states has recently eaten strawberries.  Pt denies weight loss.   Pt denies any known family hx of liver issues.   In ED,  T 98.9 P 93, R 16, Bp 1161/128 Pox 98% on RA  Urinalysis + bilirubin Prot >300,  Wbc 0-5, rbc 0-5  cpk 1,188 Tylenol <10 Na 134, K 3.5, Bun 12, Creatinine 1.20 Ast 2,380, Alt 2,801, Alk hpos 232, T. bili 12.5 Wbc 6.3, Hgb 13.2, Plt 249  ED spoke with Carol Ada, Pt will be admitted for acute hepatitis.    Review of systems:    In addition to the HPI above,  Pt denies fever, chills, n/v, abd pain, diarrhea, brbpr,   No Fever-chills, No Headache, No changes with Vision or hearing, No problems swallowing food or Liquids, No Chest pain, Cough or Shortness of Breath, No Abdominal pain, No Nausea or Vomiting, bowel movements are regular, No Blood in stool or Urine, No dysuria, No new skin rashes or bruises, No new joints pains-aches,  No new weakness, tingling, numbness in any extremity, No recent weight gain or loss, No polyuria, polydypsia or polyphagia, No significant Mental Stressors.  All other systems reviewed and are negative.    Past History of the following :    Past Medical History:  Diagnosis Date  . Acute hepatitis 07/10/2019     No medical  history  History reviewed. No pertinent surgical history. No past surgical history   Social History:      Social History   Tobacco Use  . Smoking status: Current Every Day Smoker    Packs/day: 1.00    Types: Cigarettes  . Smokeless tobacco: Never Used  Substance Use Topics  . Alcohol use: Yes    Comment: pt states, " I drink about 2 bottles a day."       Family History :     Family History  Problem Relation Age of Onset  . Diabetes Mother        Home Medications:   Prior to Admission medications   Medication Sig Start Date End Date Taking? Authorizing Provider  acetaminophen (TYLENOL) 500 MG tablet Take 1,000 mg by mouth every 6 (six) hours as needed for moderate pain.    [provider]  amLODipine (NORVASC) 5 MG tablet Take 1 tablet (5 mg total) by mouth daily. 05/03/19  Nils Flack, Mina A, PA-C  diphenhydrAMINE (BENADRYL) 25 mg capsule Take 1 capsule (25 mg total) every 6 (six) hours for 2 days by mouth. 04/10/17 04/12/17  Gareth Morgan, MD  ranitidine (ZANTAC) 150 MG capsule Take 1 capsule (150 mg total) 2 (two) times daily for 3 days by mouth. 04/10/17 04/13/17  Gareth Morgan, MD     Allergies:    No Known Allergies   Physical Exam:   Vitals  Blood pressure (!) 151/106, pulse 100, temperature 98 F (36.7 C), temperature source Oral, resp. rate 16, SpO2 98 %.  1.  General: axoxo3  2. Psychiatric: euthymic  3. Neurologic: nonfocal  4. HEENMT:  Icteric, pupils 1.59m symmetric, direct, consensual, intact Neck: no jvd  5. Respiratory : CTAB  6. Cardiovascular : rrr s1, s2, no m/g/r  7. Gastrointestinal:  Abd: soft,. Nt, nd, +bs No HSM  8. Skin:  Ext: no c/c/e, no palmary erythema, no bruises, no asterixis  9.Musculoskeletal:  Good ROM    Data Review:    CBC Recent Labs  Lab 07/10/19 0309  WBC 6.3  HGB 13.2  HCT 38.6*  PLT 249  MCV 86.2  MCH 29.5  MCHC 34.2  RDW 15.3  LYMPHSABS 1.5  MONOABS 1.1*  EOSABS 0.1    BASOSABS 0.1   ------------------------------------------------------------------------------------------------------------------  Results for orders placed or performed during the hospital encounter of 07/09/19 (from the past 48 hour(s))  Urinalysis, Routine w reflex microscopic     Status: Abnormal   Collection Time: 07/10/19  1:32 AM  Result Value Ref Range   Color, Urine AMBER (A) YELLOW    Comment: BIOCHEMICALS MAY BE AFFECTED BY COLOR   APPearance CLEAR CLEAR   Specific Gravity, Urine 1.029 1.005 - 1.030   pH 5.0 5.0 - 8.0   Glucose, UA NEGATIVE NEGATIVE mg/dL   Hgb urine dipstick MODERATE (A) NEGATIVE   Bilirubin Urine MODERATE (A) NEGATIVE   Ketones, ur 5 (A) NEGATIVE mg/dL   Protein, ur >=300 (A) NEGATIVE mg/dL   Nitrite NEGATIVE NEGATIVE   Leukocytes,Ua NEGATIVE NEGATIVE   RBC / HPF 0-5 0 - 5 RBC/hpf   WBC, UA 0-5 0 - 5 WBC/hpf   Bacteria, UA RARE (A) NONE SEEN   Squamous Epithelial / LPF 0-5 0 - 5   Mucus PRESENT    Hyaline Casts, UA PRESENT    Ca Oxalate Crys, UA PRESENT     Comment: Performed at [facility] [address] [place] [address] Rapid urine drug screen (hospital performed)     Status: Abnormal   Collection Time: 07/10/19  1:32 AM  Result Value Ref Range   Opiates NONE DETECTED NONE DETECTED   Cocaine POSITIVE (A) NONE DETECTED   Benzodiazepines NONE DETECTED NONE DETECTED   Amphetamines POSITIVE (A) NONE DETECTED   Tetrahydrocannabinol POSITIVE (A) NONE DETECTED   Barbiturates NONE DETECTED NONE DETECTED    Comment: (NOTE) DRUG SCREEN FOR MEDICAL PURPOSES ONLY.  IF CONFIRMATION IS NEEDED FOR ANY PURPOSE, NOTIFY LAB WITHIN 5 DAYS. LOWEST DETECTABLE LIMITS FOR URINE DRUG SCREEN Drug Class                     Cutoff (ng/mL) Amphetamine and metabolites    1000 Barbiturate and metabolites    200 Benzodiazepine                 2564Tricyclics and metabolites     300 Opiates and metabolites  300 Cocaine and metabolites         300 THC                            50 Performed at [facility], [place] [address], [place], [place] [address]   CBC with Differential     Status: Abnormal   Collection Time: 07/10/19  3:09 AM  Result Value Ref Range   WBC 6.3 4.0 - 10.5 K/uL   RBC 4.48 4.22 - 5.81 MIL/uL   Hemoglobin 13.2 13.0 - 17.0 g/dL   HCT 38.6 (L) 39.0 - 52.0 %   MCV 86.2 80.0 - 100.0 fL   MCH 29.5 26.0 - 34.0 pg   MCHC 34.2 30.0 - 36.0 g/dL   RDW 15.3 11.5 - 15.5 %   Platelets 249 150 - 400 K/uL   nRBC 0.0 0.0 - 0.2 %   Neutrophils Relative % 57 %   Neutro Abs 3.6 1.7 - 7.7 K/uL   Lymphocytes Relative 23 %   Lymphs Abs 1.5 0.7 - 4.0 K/uL   Monocytes Relative 18 %   Monocytes Absolute 1.1 (H) 0.1 - 1.0 K/uL   Eosinophils Relative 1 %   Eosinophils Absolute 0.1 0.0 - 0.5 K/uL   Basophils Relative 1 %   Basophils Absolute 0.1 0.0 - 0.1 K/uL   Immature Granulocytes 0 %   Abs Immature Granulocytes 0.01 0.00 - 0.07 K/uL    Comment: Performed at [facility] [address] [place], [place] [address]  Comprehensive metabolic panel     Status: Abnormal   Collection Time: 07/10/19  3:09 AM  Result Value Ref Range   Sodium 134 (L) 135 - 145 mmol/L   Potassium 3.5 3.5 - 5.1 mmol/L   Chloride 99 98 - 111 mmol/L   CO2 23 22 - 32 mmol/L   Glucose, Bld 96 70 - 99 mg/dL   BUN 12 6 - 20 mg/dL   Creatinine, Ser 1.20 0.61 - 1.24 mg/dL   Calcium 9.5 8.9 - 10.3 mg/dL   Total Protein 8.2 (H) 6.5 - 8.1 g/dL   Albumin 3.9 3.5 - 5.0 g/dL   AST 2,380 (H) 15 - 41 U/L    Comment: RESULTS CONFIRMED BY MANUAL DILUTION   ALT 2,801 (H) 0 - 44 U/L    Comment: RESULTS CONFIRMED BY MANUAL DILUTION   Alkaline Phosphatase 232 (H) 38 - 126 U/L   Total Bilirubin 12.5 (H) 0.3 - 1.2 mg/dL   GFR calc non Af Amer >60 >60 mL/min   GFR calc Af Amer >60 >60 mL/min   Anion gap 12 5 - 15    Comment: Performed at [facility], [place] [address] [place], [place] [address]  CK     Status: Abnormal   Collection Time:  07/10/19  3:09 AM  Result Value Ref Range   Total CK 1,188 (H) 49 - 397 U/L    Comment: Performed at [facility], [place] [address] [place], [place] [address]  Acetaminophen level     Status: Abnormal   Collection Time: 07/10/19  4:51 AM  Result Value Ref Range   Acetaminophen (Tylenol), Serum <10 (L) 10 - 30 ug/mL    Comment: (NOTE) Therapeutic concentrations vary significantly. A range of 10-30 ug/mL  may be an effective concentration for many patients. However, some  are best treated at concentrations outside of this range. Acetaminophen concentrations >150 ug/mL at  4 hours after ingestion  and >50 ug/mL at 12 hours after ingestion are often associated with  toxic reactions. Performed at [facility], [place] [address] [place], [place] [address]   Ethanol     Status: None   Collection Time: 07/10/19  4:51 AM  Result Value Ref Range   Alcohol, Ethyl (B) <10 <10 mg/dL    Comment: (NOTE) Lowest detectable limit for serum alcohol is 10 mg/dL. For medical purposes only. Performed at [facility], [place] [address], [place], [place] [address]     Chemistries  Recent Labs  Lab 07/10/19 0309  NA 134*  K 3.5  CL 99  CO2 23  GLUCOSE 96  BUN 12  CREATININE 1.20  CALCIUM 9.5  AST 2,380*  ALT 2,801*  ALKPHOS 232*  BILITOT 12.5*   ------------------------------------------------------------------------------------------------------------------  ------------------------------------------------------------------------------------------------------------------ GFR: CrCl cannot be calculated (Unknown ideal weight.). Liver Function Tests: Recent Labs  Lab 07/10/19 0309  AST 2,380*  ALT 2,801*  ALKPHOS 232*  BILITOT 12.5*  PROT 8.2*  ALBUMIN 3.9   No results for input(s): LIPASE, AMYLASE in the last 168 hours. No results for input(s): AMMONIA in the last 168 hours. Coagulation Profile: No results for input(s): INR, PROTIME in the last 168 hours. Cardiac  Enzymes: Recent Labs  Lab 07/10/19 0309  CKTOTAL 1,188*   BNP (last 3 results) No results for input(s): PROBNP in the last 8760 hours. HbA1C: No results for input(s): HGBA1C in the last 72 hours. CBG: No results for input(s): GLUCAP in the last 168 hours. Lipid Profile: No results for input(s): CHOL, HDL, LDLCALC, TRIG, CHOLHDL, LDLDIRECT in the last 72 hours. Thyroid Function Tests: No results for input(s): TSH, T4TOTAL, FREET4, T3FREE, THYROIDAB in the last 72 hours. Anemia Panel: No results for input(s): VITAMINB12, FOLATE, FERRITIN, TIBC, IRON, RETICCTPCT in the last 72 hours.  --------------------------------------------------------------------------------------------------------------- Urine analysis:    Component Value Date/Time   COLORURINE AMBER (A) 07/10/2019 0132   APPEARANCEUR CLEAR 07/10/2019 0132   LABSPEC 1.029 07/10/2019 0132   PHURINE 5.0 07/10/2019 0132   GLUCOSEU NEGATIVE 07/10/2019 0132   HGBUR MODERATE (A) 07/10/2019 0132   BILIRUBINUR MODERATE (A) 07/10/2019 0132   KETONESUR 5 (A) 07/10/2019 0132   PROTEINUR >=300 (A) 07/10/2019 0132   NITRITE NEGATIVE 07/10/2019 0132   LEUKOCYTESUR NEGATIVE 07/10/2019 0132      Imaging Results:    No results found.     Assessment & Plan:    Principal Problem:   Abnormal liver function  Abnormal liver function Acute hepatitis Check acute hepatitis panel, please follow up Check RUQ ultrasound, please follow up Check cmp in am Please consult GI this AM  Rhabdomyolysis Hydrate w ns iv  Check cpk in am Check cmp in am  Hypertension uncontrolled,  ? Due to IVF Hydralazine 72m iv q6h prn   DVT Prophylaxis-   Await INR,  SCDs   AM Labs Ordered, also please review Full Orders  Family Communication: Admission, patients condition and plan of care including tests being ordered have been discussed with the patient  who indicate understanding and agree with the plan and Code Status.  Code Status:  FULL  CODE per patient,   Admission status: Observation: Based on patients clinical presentation and evaluation of above clinical data, I have made determination that patient meets Observation criteria at this time.  Pt may require inpatient stay  > 2 nites if cpk and liver function not improving rapidly  Time spent in minutes : 55  minutes   Jani Gravel M.D on 07/10/2019 at 6:37 AM

## 2019-07-10 NOTE — Progress Notes (Signed)
This RN walked by patients room and patient was standing at door RN asked patient if she could help him. Patient asked if the IV pole would reach the bathroom RN said "yes it just needs to be un plugged from the wall" RN walked into room to unplug IV pole Patient states "no need to now I done pissed into the trash can" RN responded with "is there a reason you did not use your call bell to ask for help" patient went off cussing at this RN telling this RN she was disrespecting him. RN walked out of room and has informed security.

## 2019-07-10 NOTE — Consult Note (Addendum)
Reason for Consult: Hepatitis Referring Physician: [facility]  Albert Blake HPI: This is a 34 year old male who presents to the ER with dark urine.  Further evaluation shows that the patient has an acute hepatitis:  AST 2380, ALT 2801, AP 232, TB 12.5.  Symptomatically he reports that his symptoms started some time ago and he also noticed a 40 lbs weight loss over a one year time period.  This weight loss was unintentional.  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  A GC probe on 11/15/2009 was positive for gonorrhea.  He denies any toxic ingestions and his last drink of ETOH, in the form of beer, was 3 days ago.  The patient does not report heavy ETOH use.  His current INR is at 1.4  Past Medical History:  Diagnosis Date  . Acute hepatitis 07/10/2019    History reviewed. No pertinent surgical history.  Family History  Problem Relation Age of Onset  . Diabetes Mother     Social History:  reports that he has been smoking cigarettes. He has been smoking about 1.00 pack per day. He has never used smokeless tobacco. He reports current alcohol use. He reports current drug use. Drugs: Cocaine and Marijuana.  Allergies: No Known Allergies  Medications:  Scheduled:   Continuous: . sodium chloride 1,000 mL (07/10/19 0654)    Results for orders placed or performed during the hospital encounter of 07/09/19 (from the past 24 hour(s))  Urinalysis, Routine w reflex microscopic     Status: Abnormal   Collection Time: 07/10/19  1:32 AM  Result Value Ref Range   Color, Urine AMBER (A) YELLOW   APPearance CLEAR CLEAR   Specific Gravity, Urine 1.029 1.005 - 1.030   pH 5.0 5.0 - 8.0   Glucose, UA NEGATIVE NEGATIVE mg/dL   Hgb urine dipstick MODERATE (A) NEGATIVE   Bilirubin Urine MODERATE (A) NEGATIVE   Ketones, ur 5 (A) NEGATIVE mg/dL   Protein, ur >=832 (A) NEGATIVE mg/dL   Nitrite NEGATIVE NEGATIVE   Leukocytes,Ua NEGATIVE NEGATIVE   RBC / HPF 0-5  0 - 5 RBC/hpf   WBC, UA 0-5 0 - 5 WBC/hpf   Bacteria, UA RARE (A) NONE SEEN   Squamous Epithelial / LPF 0-5 0 - 5   Mucus PRESENT    Hyaline Casts, UA PRESENT    Ca Oxalate Crys, UA PRESENT   Rapid urine drug screen (hospital performed)     Status: Abnormal   Collection Time: 07/10/19  1:32 AM  Result Value Ref Range   Opiates NONE DETECTED NONE DETECTED   Cocaine POSITIVE (A) NONE DETECTED   Benzodiazepines NONE DETECTED NONE DETECTED   Amphetamines POSITIVE (A) NONE DETECTED   Tetrahydrocannabinol POSITIVE (A) NONE DETECTED   Barbiturates NONE DETECTED NONE DETECTED  CBC with Differential     Status: Abnormal   Collection Time: 07/10/19  3:09 AM  Result Value Ref Range   WBC 6.3 4.0 - 10.5 K/uL   RBC 4.48 4.22 - 5.81 MIL/uL   Hemoglobin 13.2 13.0 - 17.0 g/dL   HCT 54.9 (L) 82.6 - 41.5 %   MCV 86.2 80.0 - 100.0 fL   MCH 29.5 26.0 - 34.0 pg   MCHC 34.2 30.0 - 36.0 g/dL   RDW 83.0 94.0 - 76.8 %   Platelets 249 150 - 400 K/uL   nRBC 0.0 0.0 - 0.2 %   Neutrophils Relative % 57 %   Neutro Abs 3.6 1.7 - 7.7  K/uL   Lymphocytes Relative 23 %   Lymphs Abs 1.5 0.7 - 4.0 K/uL   Monocytes Relative 18 %   Monocytes Absolute 1.1 (H) 0.1 - 1.0 K/uL   Eosinophils Relative 1 %   Eosinophils Absolute 0.1 0.0 - 0.5 K/uL   Basophils Relative 1 %   Basophils Absolute 0.1 0.0 - 0.1 K/uL   Immature Granulocytes 0 %   Abs Immature Granulocytes 0.01 0.00 - 0.07 K/uL  Comprehensive metabolic panel     Status: Abnormal   Collection Time: 07/10/19  3:09 AM  Result Value Ref Range   Sodium 134 (L) 135 - 145 mmol/L   Potassium 3.5 3.5 - 5.1 mmol/L   Chloride 99 98 - 111 mmol/L   CO2 23 22 - 32 mmol/L   Glucose, Bld 96 70 - 99 mg/dL   BUN 12 6 - 20 mg/dL   Creatinine, Ser 1.20 0.61 - 1.24 mg/dL   Calcium 9.5 8.9 - 10.3 mg/dL   Total Protein 8.2 (H) 6.5 - 8.1 g/dL   Albumin 3.9 3.5 - 5.0 g/dL   AST 2,380 (H) 15 - 41 U/L   ALT 2,801 (H) 0 - 44 U/L   Alkaline Phosphatase 232 (H) 38 - 126 U/L    Total Bilirubin 12.5 (H) 0.3 - 1.2 mg/dL   GFR calc non Af Amer >60 >60 mL/min   GFR calc Af Amer >60 >60 mL/min   Anion gap 12 5 - 15  CK     Status: Abnormal   Collection Time: 07/10/19  3:09 AM  Result Value Ref Range   Total CK 1,188 (H) 49 - 397 U/L  Acetaminophen level     Status: Abnormal   Collection Time: 07/10/19  4:51 AM  Result Value Ref Range   Acetaminophen (Tylenol), Serum <10 (L) 10 - 30 ug/mL  Ethanol     Status: None   Collection Time: 07/10/19  4:51 AM  Result Value Ref Range   Alcohol, Ethyl (B) <10 <10 mg/dL  Protime-INR     Status: Abnormal   Collection Time: 07/10/19  6:14 AM  Result Value Ref Range   Prothrombin Time 16.7 (H) 11.4 - 15.2 seconds   INR 1.4 (H) 0.8 - 1.2  APTT     Status: None   Collection Time: 07/10/19  6:14 AM  Result Value Ref Range   aPTT 32 24 - 36 seconds     No results found.  ROS:  As stated above in the HPI otherwise negative.  Blood pressure (!) 165/108, pulse 79, temperature 98 F (36.7 C), temperature source Oral, resp. rate 16, SpO2 100 %.    PE: Gen: NAD, Alert and Oriented x 5 HEENT:  [place]/AT, EOMI Neck: Supple, no LAD Lungs: CTA Bilaterally CV: RRR without M/G/R ABM: Soft, NTND, +BS Ext: No C/C/E, no asterixis.  Assessment/Plan: 1) Acute hepatitis. 2) IV drug abuse.   The patient's acute viral hepatitis panel is pending.  He has risk factors for HBV and HAV.  There is no discernable history for DILI.  He is not encephalopathic and his INR is essentially normal.  Plan: 1) Monitor for hepatic encephalopathy. 2) Follow INR and liver enzymes. 3) Await acute hepatitis panel results.  ADDENDUM:    The patient is POSITIVE FOR ACUTE HBV.  This should resolve with time and it is expected that he will seroconvert to HBsAb+.  The patient will require follow up in the next 6 months to ensure that he seroconverts.  Jsiah Menta  D 07/10/2019, 7:20 AM

## 2019-07-10 NOTE — ED Notes (Signed)
Transported to U/s 

## 2019-07-11 ENCOUNTER — Other Ambulatory Visit: Payer: Self-pay

## 2019-07-11 DIAGNOSIS — B169 Acute hepatitis B without delta-agent and without hepatic coma: Secondary | ICD-10-CM

## 2019-07-11 DIAGNOSIS — I10 Essential (primary) hypertension: Secondary | ICD-10-CM

## 2019-07-11 DIAGNOSIS — R748 Abnormal levels of other serum enzymes: Secondary | ICD-10-CM

## 2019-07-11 LAB — CBC
HCT: 36.3 % — ABNORMAL LOW (ref 39.0–52.0)
Hemoglobin: 12.3 g/dL — ABNORMAL LOW (ref 13.0–17.0)
MCH: 29.1 pg (ref 26.0–34.0)
MCHC: 33.9 g/dL (ref 30.0–36.0)
MCV: 85.8 fL (ref 80.0–100.0)
Platelets: 240 10*3/uL (ref 150–400)
RBC: 4.23 MIL/uL (ref 4.22–5.81)
RDW: 15.9 % — ABNORMAL HIGH (ref 11.5–15.5)
WBC: 4.5 10*3/uL (ref 4.0–10.5)
nRBC: 0 % (ref 0.0–0.2)

## 2019-07-11 LAB — COMPREHENSIVE METABOLIC PANEL
ALT: 2227 U/L — ABNORMAL HIGH (ref 0–44)
AST: 2193 U/L — ABNORMAL HIGH (ref 15–41)
Albumin: 2.8 g/dL — ABNORMAL LOW (ref 3.5–5.0)
Alkaline Phosphatase: 182 U/L — ABNORMAL HIGH (ref 38–126)
Anion gap: 9 (ref 5–15)
BUN: 6 mg/dL (ref 6–20)
CO2: 24 mmol/L (ref 22–32)
Calcium: 8.9 mg/dL (ref 8.9–10.3)
Chloride: 106 mmol/L (ref 98–111)
Creatinine, Ser: 1.02 mg/dL (ref 0.61–1.24)
GFR calc Af Amer: >60 mL/min (ref >60)
GFR calc non Af Amer: >60 mL/min (ref >60)
Glucose, Bld: 68 mg/dL — ABNORMAL LOW (ref 70–99)
Potassium: 3.4 mmol/L — ABNORMAL LOW (ref 3.5–5.1)
Sodium: 139 mmol/L (ref 135–145)
Total Bilirubin: 12.2 mg/dL — ABNORMAL HIGH (ref 0.3–1.2)
Total Protein: 6.4 g/dL — ABNORMAL LOW (ref 6.5–8.1)

## 2019-07-11 LAB — CK TOTAL AND CKMB (NOT AT ARMC)
CK, MB: 4.3 ng/mL (ref 0.5–5.0)
Relative Index: 1 (ref 0.0–2.5)
Total CK: 447 U/L — ABNORMAL HIGH (ref 49–397)

## 2019-07-11 LAB — PROTIME-INR
INR: 1.4 — ABNORMAL HIGH (ref 0.8–1.2)
Prothrombin Time: 17 seconds — ABNORMAL HIGH (ref 11.4–15.2)

## 2019-07-11 LAB — MAGNESIUM: Magnesium: 1.7 mg/dL (ref 1.7–2.4)

## 2019-07-11 LAB — HEPATITIS PANEL, ACUTE
HCV Ab: NONREACTIVE
Hep A IgM: NONREACTIVE
Hep B C IgM: REACTIVE — AB
Hepatitis B Surface Ag: REACTIVE — AB

## 2019-07-11 MED ORDER — POTASSIUM CHLORIDE CRYS ER 20 MEQ PO TBCR
40.0000 meq | EXTENDED_RELEASE_TABLET | Freq: Once | ORAL | Status: AC
  Administered 2019-07-11: 10:00:00 40 meq via ORAL
  Filled 2019-07-11: qty 2

## 2019-07-11 MED ORDER — AMLODIPINE BESYLATE 10 MG PO TABS
10.0000 mg | ORAL_TABLET | Freq: Every day | ORAL | Status: DC
  Administered 2019-07-11: 10 mg via ORAL
  Filled 2019-07-11: qty 1

## 2019-07-11 MED ORDER — AMLODIPINE BESYLATE 10 MG PO TABS: 10.0000 mg | ORAL_TABLET | Freq: Every day | ORAL | 0 refills | Status: DC

## 2019-07-11 MED ORDER — HYDRALAZINE HCL 50 MG PO TABS: 50.0000 mg | ORAL_TABLET | Freq: Three times a day (TID) | ORAL | 0 refills | Status: DC

## 2019-07-11 MED ORDER — HYDRALAZINE HCL 50 MG PO TABS
50.0000 mg | ORAL_TABLET | Freq: Three times a day (TID) | ORAL | Status: DC
  Administered 2019-07-11: 10:00:00 50 mg via ORAL
  Filled 2019-07-11: qty 1

## 2019-07-11 NOTE — Care Management (Signed)
Patient information sent to CMA to schedule PCP appointment.

## 2019-07-11 NOTE — Discharge Summary (Signed)
Physician Discharge Summary  Albert Blake GXQ:[id number] DOB: [birth year] DOA: 07/09/2019  PCP: Patient, No Pcp Per  Admit date: 07/09/2019 Discharge date: 07/11/2019  Admitted From: Home Disposition: Home  Recommendations for Outpatient Follow-up:  1. Follow up with PCP in 1 week with repeat CMP 2. Outpatient follow-up with gastroenterology/Dr. Elnoria Howard within a week 3. Patient needs to be compliant with his medications and follow-up 4. Abstain from illicit drug use including cocaine 5. Follow up in ED if symptoms worsen or new appear   Home Health: No Equipment/Devices: None  Discharge Condition: Guarded to poor if patient continues to do illicit drugs CODE STATUS: Full Diet recommendation: Heart healthy  Brief/Interim Summary: 34 year old male with history of tobacco dependence presented with abnormal liver function with AST and ALT in the 2000s and total bili of 12.5. CK level of 1188. He was started on IV fluids. GI was consulted. During the hospitalization, his mental status remained stable. Right upper quadrant ultrasound showed features of acute hepatitis but no discrete hepatic lesions or ascites and no stones or biliary sludge. Repeat LFTs are still elevated but improving. CK level has also improved. Hepatitis B surface antigen is positive. GI recommends outpatient follow-up of LFTs. He is tolerating diet. GI has cleared the patient for discharge.  Discharge Diagnoses:   Acute hepatitis/elevated liver function -Patient tested positive for hepatitis B surface antigen. Patient probably has acute hepatitis B causing elevated liver function. LFTs are slightly improving although AST and ALT are still above 2000 and total bili is 12.2 today. Patient is not encephalopathic and tolerating diet. INR is 1.4. He was treated with IV fluids. Serum acetaminophen level was less than 10. HIV screen nonreactive. -GI evaluation appreciated. -Right upper quadrant ultrasound showed features of  acute hepatitis but no discrete hepatic lesions or ascites and no stones or biliary sludge. - GI recommends outpatient follow-up of LFTs. He is tolerating diet. GI has cleared the patient for discharge. -Patient will need close outpatient follow-up with GI.  Elevated CK levels -Presented with CK of 1188. Treated with IV fluids. CK 447 this morning.  Polysubstance abuse -Urine drug screen was positive for amphetamines, cocaine and tetrahydrocannabinol. Counseled regarding cessation. Social worker has been consulted.  Hypertension, uncontrolled -Blood pressure still elevated. Increase amlodipine to 10 mg daily and hydralazine to 50 mg 3 times a day upon discharge. Comply with medications and follow-up with PCP at earliest convenience.  Discharge Instructions    Allergies as of 07/11/2019   No Known Allergies     Medication List    STOP taking these medications   acetaminophen 500 MG tablet Commonly known as: TYLENOL   ALPRAZolam 0.5 MG tablet Commonly known as: XANAX   diphenhydrAMINE 25 mg capsule Commonly known as: BENADRYL     TAKE these medications   amLODipine 10 MG tablet Commonly known as: NORVASC Take 1 tablet (10 mg total) by mouth daily. Start taking on: July 12, 2019 What changed:   medication strength  how much to take   hydrALAZINE 50 MG tablet Commonly known as: APRESOLINE Take 1 tablet (50 mg total) by mouth every 8 (eight) hours. What changed:   medication strength  how much to take  when to take this       Follow-up Information    PCP. Schedule an appointment as soon as possible for a visit in 1 week(s).   Why: with repeat CMP       Jeani Hawking, MD. Schedule an appointment as soon as possible for  a visit in 1 week(s).   Specialty: Gastroenterology Contact information: [address] [place] [address] [phone]          No Known Allergies  Consultations:  GI.   Procedures/Studies: US Abdomen  Limited RUQ  Result Date: 07/10/2019 CLINICAL DATA:  Elevated LFTs.  Acute hepatitis. EXAM: ULTRASOUND ABDOMEN LIMITED RIGHT UPPER QUADRANT COMPARISON:  None. FINDINGS: Gallbladder: The gallbladder is suboptimally evaluated as the patient is not NPO. Mild apparent gallbladder wall thickening, potentially accentuated due to underdistention due to non NPO status. No echogenic gallstones or biliary sludge. No definitive pericholecystic fluid. Negative sonographic Murphy sign. Common bile duct: Diameter: Normal in size measuring 3 mm in diameter Liver: Questioned increased echogenicity about portal triad (starry sky appearance). Otherwise, normal echogenicity of the hepatic parenchyma. Question minimal amount of intrahepatic biliary ductal dilatation (representative image 39. No discrete hepatic lesions. Portal vein is patent on color Doppler imaging with normal direction of blood flow towards the liver. No ascites. Other: Incidentally noted diffuse increased echogenicity of the incidentally imaged right kidney without evidence of urinary obstruction (representative image 32). IMPRESSION: 1. Questioned "starry sky" appearance of the hepatic parenchyma as could be seen in the setting of an acute hepatitis. No discrete hepatic lesions. No ascites. 2. Suspected minimal intrahepatic biliary duct dilatation. 3. Apparent gallbladder wall thickening, potentially accentuated due to patient's non NPO status. No echogenic stones or biliary sludge. Negative sonographic Murphy's sign. 4. Increased echogenicity of the right kidney without evidence of urinary obstruction, nonspecific though could be seen in the setting medical renal disease. Electronically Signed   By: Simonne Come M.D.   On: 07/10/2019 07:55       Subjective: Patient seen and examined at bedside. He denies any worsening abdominal pain, nausea, vomiting or fever. Tolerating diet. Poor historian.  Discharge Exam: Vitals:   07/11/19 0449 07/11/19 0958  BP:  (!) 150/99 (!) 158/106  Pulse: 66 64  Resp: 18 16  Temp: 98 F (36.7 C) 98.4 F (36.9 C)  SpO2: 99% 100%    General: Pt is alert, awake, not in acute distress Cardiovascular: rate controlled, S1/S2 + Respiratory: bilateral decreased breath sounds at bases Abdominal: Soft, NT, ND, bowel sounds + Extremities: no edema, no cyanosis    The results of significant diagnostics from this hospitalization (including imaging, microbiology, ancillary and laboratory) are listed below for reference.     Microbiology: Recent Results (from the past 240 hour(s))  SARS CORONAVIRUS 2 (TAT 6-24 HRS) Nasopharyngeal Nasopharyngeal Swab     Status: None   Collection Time: 07/10/19  6:02 AM   Specimen: Nasopharyngeal Swab  Result Value Ref Range Status   SARS Coronavirus 2 NEGATIVE NEGATIVE Final    Comment: (NOTE) SARS-CoV-2 target nucleic acids are NOT DETECTED. The SARS-CoV-2 RNA is generally detectable in upper and lower respiratory specimens during the acute phase of infection. Negative results do not preclude SARS-CoV-2 infection, do not rule out co-infections with other pathogens, and should not be used as the sole basis for treatment or other patient management decisions. Negative results must be combined with clinical observations, patient history, and epidemiological information. The expected result is Negative. Fact Sheet for Patients: HairSlick.no Fact Sheet for Healthcare Providers: quierodirigir.com This test is not yet approved or cleared by the [place] FDA and  has been authorized for detection and/or diagnosis of SARS-CoV-2 by FDA under an Emergency Use Authorization (EUA). This EUA will remain  in effect (meaning this test can be used) for the  duration of the COVID-19 declaration under Section 56 4(b)(1) of the Act, 21 U.S.C. section 360bbb-3(b)(1), unless the authorization is terminated or revoked sooner. Performed  at [facility], [address], [place], [place] [address]      Labs: BNP (last 3 results) No results for input(s): BNP in the last 8760 hours. Basic Metabolic Panel: Recent Labs  Lab 07/10/19 0309 07/11/19 0548  NA 134* 139  K 3.5 3.4*  CL 99 106  CO2 23 24  GLUCOSE 96 68*  BUN 12 6  CREATININE 1.20 1.02  CALCIUM 9.5 8.9  MG  --  1.7   Liver Function Tests: Recent Labs  Lab 07/10/19 0309 07/11/19 0548  AST 2,380* 2,193*  ALT 2,801* 2,227*  ALKPHOS 232* 182*  BILITOT 12.5* 12.2*  PROT 8.2* 6.4*  ALBUMIN 3.9 2.8*   No results for input(s): LIPASE, AMYLASE in the last 168 hours. No results for input(s): AMMONIA in the last 168 hours. CBC: Recent Labs  Lab 07/10/19 0309 07/11/19 0548  WBC 6.3 4.5  NEUTROABS 3.6  --   HGB 13.2 12.3*  HCT 38.6* 36.3*  MCV 86.2 85.8  PLT 249 240   Cardiac Enzymes: Recent Labs  Lab 07/10/19 0309 07/11/19 0548  CKTOTAL 1,188* 447*  CKMB  --  4.3   BNP: Invalid input(s): POCBNP CBG: No results for input(s): GLUCAP in the last 168 hours. D-Dimer No results for input(s): DDIMER in the last 72 hours. Hgb A1c No results for input(s): HGBA1C in the last 72 hours. Lipid Profile No results for input(s): CHOL, HDL, LDLCALC, TRIG, CHOLHDL, LDLDIRECT in the last 72 hours. Thyroid function studies Recent Labs    07/10/19 0614  TSH 0.347*   Anemia work up No results for input(s): VITAMINB12, FOLATE, FERRITIN, TIBC, IRON, RETICCTPCT in the last 72 hours. Urinalysis    Component Value Date/Time   COLORURINE AMBER (A) 07/10/2019 0132   APPEARANCEUR CLEAR 07/10/2019 0132   LABSPEC 1.029 07/10/2019 0132   PHURINE 5.0 07/10/2019 0132   GLUCOSEU NEGATIVE 07/10/2019 0132   HGBUR MODERATE (A) 07/10/2019 0132   BILIRUBINUR MODERATE (A) 07/10/2019 0132   KETONESUR 5 (A) 07/10/2019 0132   PROTEINUR >=300 (A) 07/10/2019 0132   NITRITE NEGATIVE 07/10/2019 0132   LEUKOCYTESUR NEGATIVE 07/10/2019 0132   Sepsis Labs Invalid  input(s): PROCALCITONIN,  WBC,  LACTICIDVEN Microbiology Recent Results (from the past 240 hour(s))  SARS CORONAVIRUS 2 (TAT 6-24 HRS) Nasopharyngeal Nasopharyngeal Swab     Status: None   Collection Time: 07/10/19  6:02 AM   Specimen: Nasopharyngeal Swab  Result Value Ref Range Status   SARS Coronavirus 2 NEGATIVE NEGATIVE Final    Comment: (NOTE) SARS-CoV-2 target nucleic acids are NOT DETECTED. The SARS-CoV-2 RNA is generally detectable in upper and lower respiratory specimens during the acute phase of infection. Negative results do not preclude SARS-CoV-2 infection, do not rule out co-infections with other pathogens, and should not be used as the sole basis for treatment or other patient management decisions. Negative results must be combined with clinical observations, patient history, and epidemiological information. The expected result is Negative. Fact Sheet for Patients: HairSlick.no Fact Sheet for Healthcare Providers: quierodirigir.com This test is not yet approved or cleared by the [place] FDA and  has been authorized for detection and/or diagnosis of SARS-CoV-2 by FDA under an Emergency Use Authorization (EUA). This EUA will remain  in effect (meaning this test can be used) for the duration of the COVID-19 declaration under Section 56  4(b)(1) of the Act, 21 U.S.C. section 360bbb-3(b)(1), unless the authorization is terminated or revoked sooner. Performed at [facility], [place] [address] [place], [place] [address]      Time coordinating discharge: 35 minutes  SIGNED:   Aline August, MD  [facility] 07/11/2019, 10:13 AM

## 2019-07-11 NOTE — TOC Initial Note (Signed)
Transition of Care [facility]) - Initial/Assessment Note    Patient Details  Name: Albert Blake MRN: [id number] Date of Birth: November 14, 1985  Transition [facility] CM/SW Contact:    Oretha Milch, LCSW Phone Number: 07/11/2019, 11:49 AM  Clinical Narrative: CSW received consult for substance use assessment and resources. Patient reports he has been intermittently homeless and was recently staying with a friend who was using illicit substances. Patient reported he used with them and reports he does not regular use any substances. Patient reports he felt pressured in the situation and had to use. Patient reports no SA provided, no PCP provider, lack of homelessness supports.  CSW noted patient responded openly to resources and planning for follow-up. CSW discussed starting with homelessness due to the multi-social dimensional impacts of it on developing stability and discussed following up with [facility] and/or [facility] on Monday to begin enrollment into a housing program. 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   Patient additionally accepted substance use and primary care providers in the area that offer services to those uninsured with limited income. CSW noted patient reported he only had one long sleeve shirt and no jacket. CSW provided patient with available clothing to help him today when he discharges. CSW will continue to follow as needed and please reach out to Transitions of Care for any further social work or case management needs.                 Expected Discharge Plan: Homeless Shelter Barriers to Discharge: Active Substance Use - Placement, Homeless with medical needs, Inadequate or no insurance   Patient Goals and CMS Choice Patient states their goals for this hospitalization and ongoing recovery are:: "I'm trying my best just to get everything together."       Expected Discharge Plan and Services Expected Discharge Plan: Homeless Shelter   Discharge Planning Services: Other - See comment(Provided resources, developed follow-up plan)   Living arrangements for the past 2 months: Homeless Expected Discharge Date: 07/11/19                                    Prior Living Arrangements/Services Living arrangements for the past 2 months: Homeless Lives with:: Self Patient language and need for interpreter reviewed:: Yes Do you feel safe going back to the place where you live?: Yes      Need for Family Participation in Patient Care: No (Comment) Care giver support system in place?: No (comment)   Criminal Activity/Legal Involvement Pertinent to Current Situation/Hospitalization: No - Comment as needed  Activities of Daily Living Home Assistive Devices/Equipment: None ADL Screening (condition at time of admission) Patient's cognitive ability adequate to safely complete daily activities?: Yes Is the patient deaf or have difficulty hearing?: No Does the patient have difficulty seeing, even when wearing glasses/contacts?: No Does the patient have difficulty concentrating, remembering, or making decisions?: No Patient able to express need for assistance with ADLs?: Yes Does the patient have difficulty dressing or bathing?: No Independently performs ADLs?: Yes (appropriate for developmental age) Does the patient have difficulty walking or climbing stairs?: No Weakness of Legs: None Weakness of Arms/Hands: None  Permission Sought/Granted                  Emotional Assessment Appearance:: Appears stated age Attitude/Demeanor/Rapport: Charismatic Affect (  typically observed): Calm, Appropriate Orientation: : Oriented to Self, Oriented to Place, Oriented to  Time, Oriented to Situation Alcohol / Substance Use: Illicit Drugs Psych Involvement: No (comment)  Admission diagnosis:  Acute hepatitis [B17.9] Hepatitis [K75.9] Abnormal  liver function [R94.5] Patient Active Problem List   Diagnosis Date Noted  . Abnormal liver function 07/10/2019  . Rhabdomyolysis 07/10/2019   PCP:  Patient, No Pcp Per Pharmacy:   [facility] [phone] [place], [place] - [address] [place] [address] Phone: [phone] Fax: [phone]  [facility] [place], [place] - [address] [place] [address] Phone: [phone] Fax: [phone]  [id number] [place], [place] - [address] [facility] [address] [place] [address] Phone: [phone] Fax: [phone]     Social Determinants of Health (SDOH) Interventions    Readmission Risk Interventions No flowsheet data found.

## 2019-07-28 ENCOUNTER — Emergency Department (HOSPITAL_COMMUNITY)
Admission: EM | Admit: 2019-07-28 | Discharge: 2019-07-28 | Disposition: A | Discharge status: Home / Self Care | Payer: Self-pay | Attending: Emergency Medicine | Admitting: Emergency Medicine

## 2019-07-28 ENCOUNTER — Ambulatory Visit (HOSPITAL_COMMUNITY)
Admission: RE | Admit: 2019-07-28 | Discharge: 2019-07-28 | Disposition: A | Discharge status: Home / Self Care | Payer: No Typology Code available for payment source | Attending: Psychiatry | Admitting: Psychiatry

## 2019-07-28 ENCOUNTER — Other Ambulatory Visit: Payer: Self-pay

## 2019-07-28 DIAGNOSIS — Z1389 Encounter for screening for other disorder: Secondary | ICD-10-CM | POA: Insufficient documentation

## 2019-07-28 DIAGNOSIS — F17210 Nicotine dependence, cigarettes, uncomplicated: Secondary | ICD-10-CM | POA: Insufficient documentation

## 2019-07-28 DIAGNOSIS — Z79899 Other long term (current) drug therapy: Secondary | ICD-10-CM | POA: Insufficient documentation

## 2019-07-28 DIAGNOSIS — R030 Elevated blood-pressure reading, without diagnosis of hypertension: Secondary | ICD-10-CM | POA: Insufficient documentation

## 2019-07-28 DIAGNOSIS — B169 Acute hepatitis B without delta-agent and without hepatic coma: Secondary | ICD-10-CM | POA: Insufficient documentation

## 2019-07-28 LAB — CK: Total CK: 364 U/L (ref 49–397)

## 2019-07-28 LAB — CBC WITH DIFFERENTIAL/PLATELET
Abs Immature Granulocytes: 0.02 10*3/uL (ref 0.00–0.07)
Basophils Absolute: 0.1 10*3/uL (ref 0.0–0.1)
Basophils Relative: 1 %
Eosinophils Absolute: 0.1 10*3/uL (ref 0.0–0.5)
Eosinophils Relative: 2 %
HCT: 35.2 % — ABNORMAL LOW (ref 39.0–52.0)
Hemoglobin: 11.5 g/dL — ABNORMAL LOW (ref 13.0–17.0)
Immature Granulocytes: 0 %
Lymphocytes Relative: 37 %
Lymphs Abs: 1.9 10*3/uL (ref 0.7–4.0)
MCH: 29.3 pg (ref 26.0–34.0)
MCHC: 32.7 g/dL (ref 30.0–36.0)
MCV: 89.8 fL (ref 80.0–100.0)
Monocytes Absolute: 0.7 10*3/uL (ref 0.1–1.0)
Monocytes Relative: 14 %
Neutro Abs: 2.4 10*3/uL (ref 1.7–7.7)
Neutrophils Relative %: 46 %
Platelets: 396 10*3/uL (ref 150–400)
RBC: 3.92 MIL/uL — ABNORMAL LOW (ref 4.22–5.81)
RDW: 16.9 % — ABNORMAL HIGH (ref 11.5–15.5)
WBC: 5.2 10*3/uL (ref 4.0–10.5)
nRBC: 0 % (ref 0.0–0.2)

## 2019-07-28 LAB — URINALYSIS, ROUTINE W REFLEX MICROSCOPIC
Bilirubin Urine: NEGATIVE
Glucose, UA: NEGATIVE mg/dL
Hgb urine dipstick: NEGATIVE
Ketones, ur: NEGATIVE mg/dL
Leukocytes,Ua: NEGATIVE
Nitrite: NEGATIVE
Protein, ur: 100 mg/dL — AB
Specific Gravity, Urine: 1.019 (ref 1.005–1.030)
pH: 6 (ref 5.0–8.0)

## 2019-07-28 LAB — COMPREHENSIVE METABOLIC PANEL
ALT: 275 U/L — ABNORMAL HIGH (ref 0–44)
AST: 164 U/L — ABNORMAL HIGH (ref 15–41)
Albumin: 3.3 g/dL — ABNORMAL LOW (ref 3.5–5.0)
Alkaline Phosphatase: 147 U/L — ABNORMAL HIGH (ref 38–126)
Anion gap: 11 (ref 5–15)
BUN: 10 mg/dL (ref 6–20)
CO2: 23 mmol/L (ref 22–32)
Calcium: 9.3 mg/dL (ref 8.9–10.3)
Chloride: 106 mmol/L (ref 98–111)
Creatinine, Ser: 1.22 mg/dL (ref 0.61–1.24)
GFR calc Af Amer: >60 mL/min (ref >60)
GFR calc non Af Amer: >60 mL/min (ref >60)
Glucose, Bld: 89 mg/dL (ref 70–99)
Potassium: 3.6 mmol/L (ref 3.5–5.1)
Sodium: 140 mmol/L (ref 135–145)
Total Bilirubin: 4.4 mg/dL — ABNORMAL HIGH (ref 0.3–1.2)
Total Protein: 7.9 g/dL (ref 6.5–8.1)

## 2019-07-28 LAB — LIPASE, BLOOD: Lipase: 54 U/L — ABNORMAL HIGH (ref 11–51)

## 2019-07-28 LAB — PROTIME-INR
INR: 1.3 — ABNORMAL HIGH (ref 0.8–1.2)
Prothrombin Time: 15.6 seconds — ABNORMAL HIGH (ref 11.4–15.2)

## 2019-07-28 MED ORDER — SODIUM CHLORIDE 0.9 % IV BOLUS
1000.0000 mL | Freq: Once | INTRAVENOUS | Status: AC
  Administered 2019-07-28: 1000 mL via INTRAVENOUS

## 2019-07-28 MED ORDER — SODIUM CHLORIDE 0.9% FLUSH: 3.0000 mL | Freq: Once | INTRAVENOUS | Status: DC

## 2019-07-28 NOTE — ED Provider Notes (Signed)
[facility] EMERGENCY DEPARTMENT Provider Note   CSN: [id number] Arrival date & time: 07/28/19  0744     History Chief Complaint  Patient presents with  . Abdominal Pain    Albert Blake is a 34 y.o. male.  HPI Patient presents for evaluation of abdominal pain. Recently admitted for elevated liver enzymes and subsequently found to have acute Hepatitis B. He had negative RUQ ultrasound. He was advised to followup with GI as an outpatient but was unable to afford the visit fees. He continues to have persistent moderate aching diffuse abdominal pain, worse in the RUQ. He has had one or two episodes of dry heaves. Continues to drink EtOH occasionally. Denies any bloody emesis or stools. No fevers.     Past Medical History:  Diagnosis Date  . Acute hepatitis 07/10/2019    Patient Active Problem List   Diagnosis Date Noted  . Abnormal liver function 07/10/2019  . Rhabdomyolysis 07/10/2019    No past surgical history on file.     Family History  Problem Relation Age of Onset  . Diabetes Mother     Social History   Tobacco Use  . Smoking status: Current Every Day Smoker    Packs/day: 1.00    Types: Cigarettes  . Smokeless tobacco: Never Used  Substance Use Topics  . Alcohol use: Yes    Comment: pt states, " I drink about 2 bottles a day."  . Drug use: Yes    Types: Cocaine, Marijuana    Home Medications Prior to Admission medications   Medication Sig Start Date End Date Taking? Authorizing Provider  amLODipine (NORVASC) 10 MG tablet Take 1 tablet (10 mg total) by mouth daily. 07/12/19  Yes Glade Lloyd, MD  hydrALAZINE (APRESOLINE) 50 MG tablet Take 1 tablet (50 mg total) by mouth every 8 (eight) hours. Patient not taking: Reported on 07/28/2019 07/11/19   Glade Lloyd, MD    Allergies    Patient has no known allergies.  Review of Systems   Review of Systems  Constitutional: Negative for fever.  HENT: Negative for congestion and sore  throat.   Respiratory: Negative for cough and shortness of breath.   Cardiovascular: Negative for chest pain.  Gastrointestinal: Positive for abdominal pain and nausea. Negative for diarrhea and vomiting.  Genitourinary: Negative for dysuria.  Musculoskeletal: Negative for myalgias.  Skin: Negative for rash.  Neurological: Negative for headaches.  Psychiatric/Behavioral: Negative for behavioral problems.    Physical Exam Updated Vital Signs BP (!) 174/117 (BP Location: Left Arm)   Pulse 71   Temp 97.6 F (36.4 C) (Oral)   Resp 18   Ht 6\' 2"  (1.88 m)   Wt 89.4 kg   SpO2 100%   BMI 25.29 kg/m   Physical Exam Constitutional:      Appearance: Normal appearance.  HENT:     Head: Normocephalic and atraumatic.     Nose: Nose normal.     Mouth/Throat:     Mouth: Mucous membranes are moist.  Eyes:     General: Scleral icterus present.     Extraocular Movements: Extraocular movements intact.     Conjunctiva/sclera: Conjunctivae normal.  Cardiovascular:     Rate and Rhythm: Normal rate.  Pulmonary:     Effort: Pulmonary effort is normal.     Breath sounds: Normal breath sounds.  Abdominal:     General: Abdomen is flat.     Palpations: Abdomen is soft. There is no mass.     Tenderness: There  is abdominal tenderness (diffuse). There is no guarding.  Musculoskeletal:        General: No swelling. Normal range of motion.     Cervical back: Neck supple.  Skin:    General: Skin is warm and dry.  Neurological:     General: No focal deficit present.     Mental Status: He is alert.  Psychiatric:        Mood and Affect: Mood normal.     ED Results / Procedures / Treatments   Labs (all labs ordered are listed, but only abnormal results are displayed) Labs Reviewed  LIPASE, BLOOD - Abnormal; Notable for the following components:      Result Value   Lipase 54 (*)    All other components within normal limits  COMPREHENSIVE METABOLIC PANEL - Abnormal; Notable for the following  components:   Albumin 3.3 (*)    AST 164 (*)    ALT 275 (*)    Alkaline Phosphatase 147 (*)    Total Bilirubin 4.4 (*)    All other components within normal limits  URINALYSIS, ROUTINE W REFLEX MICROSCOPIC - Abnormal; Notable for the following components:   Color, Urine AMBER (*)    Protein, ur 100 (*)    Bacteria, UA RARE (*)    All other components within normal limits  CBC WITH DIFFERENTIAL/PLATELET - Abnormal; Notable for the following components:   RBC 3.92 (*)    Hemoglobin 11.5 (*)    HCT 35.2 (*)    RDW 16.9 (*)    All other components within normal limits  PROTIME-INR - Abnormal; Notable for the following components:   Prothrombin Time 15.6 (*)    INR 1.3 (*)    All other components within normal limits  CK    EKG None  Radiology No results found.  Procedures Procedures (including critical care time)  Medications Ordered in ED Medications  sodium chloride flush (NS) 0.9 % injection 3 mL (has no administration in time range)  sodium chloride 0.9 % bolus 1,000 mL (0 mLs Intravenous Stopped 07/28/19 1100)    ED Course  I have reviewed the triage vital signs and the nursing notes.  Pertinent labs & imaging results that were available during my care of the patient were reviewed by me and considered in my medical decision making (see chart for details).  Clinical Course as of Jul 28 1127  Tue Jul 28, 2019  0813 Patient with recent diagnosis of hepatitis B continues to have abdominal pain and unable to followp due to lack of resources. He has continued to drink EtOH occasionally, but denies APAP use. Will check labs to compare to values at discharge.    [CS]  1008 Labs show significant improvement from recent admission.  Will discuss with case management regarding arranging for patient to be seen by the outpatient gastroenterology clinic.  He does not have an indication for readmission.   [CS]  1127 Waited by the case manager who will refer him to the [facility] for  follow-up.   [CS]    Clinical Course User Index [CS] Truddie Hidden, MD  Final Clinical Impression(s) / ED Diagnoses Final diagnoses:  Acute viral hepatitis B without coma and without delta agent    Rx / DC Orders ED Discharge Orders    None       Truddie Hidden, MD 07/28/19 1129

## 2019-07-28 NOTE — Discharge Planning (Signed)
RNCM met with pt at bedside regarding PCP establishment.  Pt frequents the [facility] for  Other homeless resources and RNCM advised him that he should tap into their medical services.  Pt states he will visit them within the week.  RNCM notified medical team Marliss Coots, NP) of pt intent to establish PCP with them.

## 2019-07-28 NOTE — ED Triage Notes (Signed)
Patient reported he was recently diagnose with liver problems. Presents to ED today c/o "Liver pain"

## 2019-07-28 NOTE — H&P (Signed)
Behavioral Health Medical Screening Exam  Albert Blake is an 34 y.o. male patient who presents to [facility] as a walk in accompanied by his girlfriend requesting resources for substance use services.  Patient states that he use illicit drugs daily heroin, meth, cocaine, and marijuana.  Occasional alcohol and xanax.  Patient states he needs to get his life together and needs something long term so it will work. Patient denies suicidal/self-harm/homicidal ideation, psychosis, and paranoia.   Patient also has elevated blood pressure.  Took his medication while here; encouraged to go to emergency room.  Resources given for short/long term rehab facility.  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) will take picture and information from here; printed and given to patient to take with him.    Total Time spent with patient: 45 minutes  Psychiatric Specialty Exam: Physical Exam  Vitals reviewed. Constitutional: He is oriented to person, place, and time. He appears well-developed and well-nourished. No distress.  Cardiovascular:  Elevated blood pressure.  Patient instructed to go to emergency room..  Reporting hadn't taken medication in two day.  A 10 mg Norvasc given while patient sitting during assessment.  Patient had brought his home medications with him and took one of his pills.  Patient instructed if does not go down will need to go to ED  Respiratory: Effort normal.  Musculoskeletal:        General: Normal range of motion.     Cervical back: Normal range of motion.  Neurological: He is alert and oriented to person, place, and time.  Skin: Skin is warm and dry.    Review of Systems  Gastrointestinal:       Seen in [facility] ED today with complaints of abdominal pain.  Noted recently admitted for elevated liver enzymes and found acute Hepatitis B.  No complaints of abdominal pain at this time.  Psychiatric/Behavioral: Negative for confusion, hallucinations, self-injury, sleep disturbance and  suicidal ideas.    Blood pressure (!) 166/127, pulse 92, temperature 98 F (36.7 C), temperature source Oral, resp. rate 16.There is no height or weight on file to calculate BMI.  General Appearance: Casual  Eye Contact:  Good  Speech:  Clear and Coherent and Normal Rate  Volume:  Normal  Mood:  "Good"  Affect:  Appropriate and Congruent  Thought Process:  Coherent, Goal Directed and Descriptions of Associations: Intact  Orientation:  Full (Time, Place, and Person)  Thought Content:  WDL and Logical  Suicidal Thoughts:  No  Homicidal Thoughts:  No  Memory:  Immediate;   Good Recent;   Good  Judgement:  Intact  Insight:  Present  Psychomotor Activity:  Normal  Concentration: Concentration: Good and Attention Span: Good  Recall:  Good  Fund of Knowledge:Fair  Language: Good  Akathisia:  No  Handed:  Right  AIMS (if indicated):     Assets:  Communication Skills Desire for Improvement Housing Social Support  Sleep:       Musculoskeletal: Strength & Muscle Tone: within normal limits Gait & Station: normal Patient leans: N/A  Blood pressure (!) 166/127, pulse 92, temperature 98 F (36.7 C), temperature source Oral, resp. rate 16.  Recommendations:  Resources for outpatient psychiatric services, community substance use services, and short/long term rehab facilities.    Patient to go to emergency room for elevated blood pressure.    Based on my evaluation the patient appears to have an emergency medical condition for which I recommend the patient be transferred to the emergency  department for further evaluation.  Disposition:  Psychiatrically cleared No evidence of imminent risk to self or others at present.   Patient does not meet criteria for psychiatric inpatient admission. Supportive therapy provided about ongoing stressors. Discussed crisis plan, support from social network, calling 911, coming to the Emergency Department, and calling Suicide Hotline.  Vicktoria Muckey, NP 07/28/2019, 7:03 PM

## 2019-07-28 NOTE — BH Assessment (Signed)
Assessment Note  Albert Blake is a 34 y.o. male who presents voluntarily to [facility] for a walk-in assessment. Pt was accompanied by his girlfriend, reporting symptoms of depression and request for help in getting "clean and sober". Pt has a history of drug abuse (heroin, meth, ice, thc, alcohol, & cocaine). Pt reports medication compliance. He denies current suicidal ideation. He denies past suicide attempts. Pt acknowledges multiple symptoms of Depression, including anhedonia, isolating, feelings of guilt, changes in sleep & increased irritability. Pt denies homicidal ideation/ history of violence. Pt  denies auditory & visual hallucinations & other symptoms of psychosis. Pt states current stressors include increased health problems.   Pt lives with girlfriend, and supports include girlfriend. Pt denies hx of abuse. Pt reports there is a family history of depression and anxiety. Pt was in prison for 4 years, out for 2 years and no longer has probation. Pt denies current charges/legal problems. Pt has partial insight and judgment. Pt's memory is intact.  Protective factors against suicide include no current suicidal ideation, future orientation, therapeutic relationship, no access to firearms, no current psychotic symptoms and no prior attempts.?  ? MSE: Pt is somewhat disheveled, quiet-awake, oriented x4 with soft speech and normal motor behavior. Eye contact is fair. Pt's mood is pleasant and affect is constricted. Affect is congruent with mood. Thought process is coherent and relevant. There is no indication pt is currently responding to internal stimuli or experiencing delusional thought content. Pt was cooperative throughout assessment.     Diagnosis: Cocaine abuse; Opioid abuse; MDD, recurrent, moderate Disposition: Albert Rankin, NP recommends pt follow up with long-term substance abuse tx. Information and resources given to pt.  Past Medical History:  Past Medical History:  Diagnosis  Date  . Acute hepatitis 07/10/2019    No past surgical history on file.  Family History:  Family History  Problem Relation Age of Onset  . Diabetes Mother     Social History:  reports that he has been smoking cigarettes. He has been smoking about 1.00 pack per day. He has never used smokeless tobacco. He reports current alcohol use. He reports current drug use. Drugs: Cocaine and Marijuana.  Additional Social History:  Alcohol / Drug Use Pain Medications: See MAR Abuses pain medications when he has access Prescriptions: See MAR- Vistaril, Amlodipine, uses prescription drugs whenever he can (xanax) Over the Counter: NA  History of alcohol / drug use?: Yes Longest period of sobriety (when/how long): 4 years- (was in prison)  Negative Consequences of Use: Financial, Armed forces operational officer, Personal relationships, Work / School Substance #1 Name of Substance 1: heroin 1 - Last Use / Amount: 2 weeks ago Substance #2 Name of Substance 2: THC 2 - Age of First Use: 16  2 - Frequency: daily  2 - Duration: on and off since age 62 2 - Last Use / Amount: Unknown Substance #3 Name of Substance 3: Cocaine  3 - Age of First Use: 16  3 - Amount (size/oz): unknown 3 - Frequency: daily  3 - Last Use / Amount: 2 days ago  CIWA:   COWS:    Allergies: No Known Allergies  Home Medications: (Not in a hospital admission)   OB/GYN Status:  No LMP for male patient.  General Assessment Data Location of Assessment: [facility] Services TTS Assessment: In system Is this a Tele or Face-to-Face Assessment?: Face-to-Face Is this an Initial Assessment or a Re-assessment for this encounter?: Initial Assessment Patient Accompanied by:: Other(girlfriend) Language Other than English: No  Living Arrangements: Other (Comment) What gender do you identify as?: Male Marital status: Single Living Arrangements: Spouse/significant other(girlfriend) Can pt return to current living arrangement?: Yes Admission Status:  Voluntary Is patient capable of signing voluntary admission?: Yes Referral Source: Self/Family/Friend Insurance type: None     Crisis Care Plan Living Arrangements: Spouse/significant other(girlfriend) Name of Psychiatrist: None Name of Therapist: Linus Blake at [facility] x 5 months  Education Status Is patient currently in school?: No Is the patient employed, unemployed or receiving disability?: Unemployed  Risk to self with the past 6 months Suicidal Ideation: No Has patient been a risk to self within the past 6 months prior to admission? : No Suicidal Intent: No Has patient had any suicidal intent within the past 6 months prior to admission? : No Is patient at risk for suicide?: No Suicidal Plan?: No Has patient had any suicidal plan within the past 6 months prior to admission? : No What has been your use of drugs/alcohol within the last 12 months?: daily Previous Attempts/Gestures: No How many times?: 0 Other Self Harm Risks: substance abuse, depresion sx Intentional Self Injurious Behavior: None(denies) Family Suicide History: No Recent stressful life event(s): Recent negative physical changes(health issues) Persecutory voices/beliefs?: No Depression: Yes Depression Symptoms: Isolating, Loss of interest in usual pleasures, Feeling angry/irritable, Guilt, Insomnia Substance abuse history and/or treatment for substance abuse?: Yes([facility] 04/2019) Suicide prevention information given to non-admitted patients: Not applicable  Risk to Others within the past 6 months Homicidal Ideation: No Does patient have any lifetime risk of violence toward others beyond the six months prior to admission? : No Thoughts of Harm to Others: No Current Homicidal Intent: No Current Homicidal Plan: No Access to Homicidal Means: No History of harm to others?: No Assessment of Violence: None Noted Does patient have access to weapons?: No(denies) Criminal Charges Pending?: No Does patient have  a court date: No Is patient on probation?: No  Psychosis Hallucinations: None noted Delusions: None noted  Mental Status Report Appearance/Hygiene: Disheveled Eye Contact: Fair Motor Activity: Freedom of movement Speech: Soft Level of Consciousness: Quiet/awake Mood: Pleasant Affect: Constricted Anxiety Level: Minimal Thought Processes: Coherent, Relevant Judgement: Partial Orientation: Appropriate for developmental age Obsessive Compulsive Thoughts/Behaviors: None  Cognitive Functioning Concentration: Good Memory: Recent Intact, Remote Intact Is patient IDD: No Insight: Good Impulse Control: Fair Appetite: Fair Have you had any weight changes? : No Change Sleep: Decreased Total Hours of Sleep: 6 Vegetative Symptoms: None  ADLScreening [facility] Assessment Services) Patient's cognitive ability adequate to safely complete daily activities?: Yes Patient able to express need for assistance with ADLs?: No Independently performs ADLs?: Yes (appropriate for developmental age)  Prior Inpatient Therapy Prior Inpatient Therapy: Yes([facility] 2020) Prior Therapy Dates: 2020 Prior Therapy Facilty/Provider(s): [facility] Reason for Treatment: substance abuse  Prior Outpatient Therapy Prior Outpatient Therapy: Yes Prior Therapy Dates: ongoing x 5 months Prior Therapy Facilty/Provider(s): [facility] Reason for Treatment: depression Does patient have an ACCT team?: No Does patient have Intensive In-House Services?  : No Does patient have [facility] services? : No Does patient have P4CC services?: No  ADL Screening (condition at time of admission) Patient's cognitive ability adequate to safely complete daily activities?: Yes Is the patient deaf or have difficulty hearing?: No Does the patient have difficulty seeing, even when wearing glasses/contacts?: Yes Does the patient have difficulty concentrating, remembering, or making decisions?: No Patient able to express need for  assistance with ADLs?: No Does the patient have difficulty dressing or bathing?: No Independently performs  ADLs?: Yes (appropriate for developmental age) Does the patient have difficulty walking or climbing stairs?: No Weakness of Legs: None Weakness of Arms/Hands: None  Home Assistive Devices/Equipment Home Assistive Devices/Equipment: None  Therapy Consults (therapy consults require a physician order) PT Evaluation Needed: No OT Evalulation Needed: No SLP Evaluation Needed: No Abuse/Neglect Assessment (Assessment to be complete while patient is alone) Abuse/Neglect Assessment Can Be Completed: Yes Physical Abuse: Denies Verbal Abuse: Denies Sexual Abuse: Denies Exploitation of patient/patient's resources: Denies Self-Neglect: Denies Values / Beliefs Cultural Requests During Hospitalization: None Spiritual Requests During Hospitalization: None Consults Spiritual Care Consult Needed: No Transition of Care Team Consult Needed: No Advance Directives (For Healthcare) Does Patient Have a Medical Advance Directive?: No Would patient like information on creating a medical advance directive?: No - Patient declined          Disposition: Albert Rankin, NP recommends pt follow up with long-term substance abuse tx. Information and resources given to pt. Disposition Initial Assessment Completed for this Encounter: Yes Disposition of Patient: Discharge(pt interested in long term substance abuse tx- resources pro)  On Site Evaluation by:   Reviewed with Physician:    Clearnce Sorrel 07/28/2019 6:45 PM

## 2019-08-04 ENCOUNTER — Inpatient Hospital Stay (HOSPITAL_COMMUNITY)
Admission: EM | Admit: 2019-08-04 | Discharge: 2019-08-09 | DRG: 441 | Disposition: A | Discharge status: Home / Self Care | Payer: Self-pay | Attending: Internal Medicine | Admitting: Internal Medicine

## 2019-08-04 ENCOUNTER — Encounter (HOSPITAL_COMMUNITY): Payer: Self-pay | Admitting: Emergency Medicine

## 2019-08-04 ENCOUNTER — Other Ambulatory Visit: Payer: Self-pay

## 2019-08-04 DIAGNOSIS — K5900 Constipation, unspecified: Secondary | ICD-10-CM | POA: Diagnosis present

## 2019-08-04 DIAGNOSIS — B169 Acute hepatitis B without delta-agent and without hepatic coma: Principal | ICD-10-CM | POA: Diagnosis present

## 2019-08-04 DIAGNOSIS — R109 Unspecified abdominal pain: Secondary | ICD-10-CM | POA: Diagnosis present

## 2019-08-04 DIAGNOSIS — F1010 Alcohol abuse, uncomplicated: Secondary | ICD-10-CM | POA: Diagnosis present

## 2019-08-04 DIAGNOSIS — Z79899 Other long term (current) drug therapy: Secondary | ICD-10-CM

## 2019-08-04 DIAGNOSIS — I10 Essential (primary) hypertension: Secondary | ICD-10-CM | POA: Diagnosis present

## 2019-08-04 DIAGNOSIS — Z560 Unemployment, unspecified: Secondary | ICD-10-CM

## 2019-08-04 DIAGNOSIS — B1910 Unspecified viral hepatitis B without hepatic coma: Secondary | ICD-10-CM | POA: Diagnosis present

## 2019-08-04 DIAGNOSIS — U071 COVID-19: Secondary | ICD-10-CM | POA: Diagnosis present

## 2019-08-04 DIAGNOSIS — F1910 Other psychoactive substance abuse, uncomplicated: Secondary | ICD-10-CM | POA: Diagnosis present

## 2019-08-04 DIAGNOSIS — Z590 Homelessness: Secondary | ICD-10-CM

## 2019-08-04 DIAGNOSIS — F1920 Other psychoactive substance dependence, uncomplicated: Secondary | ICD-10-CM

## 2019-08-04 DIAGNOSIS — R7989 Other specified abnormal findings of blood chemistry: Secondary | ICD-10-CM

## 2019-08-04 DIAGNOSIS — K2970 Gastritis, unspecified, without bleeding: Secondary | ICD-10-CM | POA: Diagnosis present

## 2019-08-04 DIAGNOSIS — F17210 Nicotine dependence, cigarettes, uncomplicated: Secondary | ICD-10-CM | POA: Diagnosis present

## 2019-08-04 DIAGNOSIS — F1420 Cocaine dependence, uncomplicated: Secondary | ICD-10-CM | POA: Diagnosis present

## 2019-08-04 MED ORDER — SODIUM CHLORIDE 0.9% FLUSH: 3.0000 mL | Freq: Once | INTRAVENOUS | Status: DC

## 2019-08-04 NOTE — ED Triage Notes (Signed)
Pt reports lower left abdominal pain.  Pt also c/o emesis, constipation and "just feeling bad."  Pt reports he is living in his car and can't afford his medications including blood pressure medication

## 2019-08-05 ENCOUNTER — Observation Stay (HOSPITAL_COMMUNITY): Payer: Self-pay

## 2019-08-05 ENCOUNTER — Emergency Department (HOSPITAL_COMMUNITY): Payer: Self-pay

## 2019-08-05 ENCOUNTER — Encounter (HOSPITAL_COMMUNITY): Payer: Self-pay | Admitting: Radiology

## 2019-08-05 DIAGNOSIS — F1920 Other psychoactive substance dependence, uncomplicated: Secondary | ICD-10-CM

## 2019-08-05 DIAGNOSIS — R109 Unspecified abdominal pain: Secondary | ICD-10-CM | POA: Diagnosis present

## 2019-08-05 DIAGNOSIS — R1011 Right upper quadrant pain: Secondary | ICD-10-CM

## 2019-08-05 DIAGNOSIS — I10 Essential (primary) hypertension: Secondary | ICD-10-CM

## 2019-08-05 DIAGNOSIS — B1910 Unspecified viral hepatitis B without hepatic coma: Secondary | ICD-10-CM

## 2019-08-05 LAB — URINALYSIS, ROUTINE W REFLEX MICROSCOPIC
Bacteria, UA: NONE SEEN
Glucose, UA: NEGATIVE mg/dL
Hgb urine dipstick: NEGATIVE
Ketones, ur: NEGATIVE mg/dL
Leukocytes,Ua: NEGATIVE
Nitrite: NEGATIVE
Protein, ur: 100 mg/dL — AB
Specific Gravity, Urine: 1.03 (ref 1.005–1.030)
pH: 5 (ref 5.0–8.0)

## 2019-08-05 LAB — ACETAMINOPHEN LEVEL: Acetaminophen (Tylenol), Serum: <10 ug/mL — ABNORMAL LOW (ref 10–30)

## 2019-08-05 LAB — PROTIME-INR
INR: 1.3 — ABNORMAL HIGH (ref 0.8–1.2)
Prothrombin Time: 16.1 seconds — ABNORMAL HIGH (ref 11.4–15.2)

## 2019-08-05 LAB — RAPID URINE DRUG SCREEN, HOSP PERFORMED
Amphetamines: NOT DETECTED
Barbiturates: NOT DETECTED
Benzodiazepines: NOT DETECTED
Cocaine: POSITIVE — AB
Opiates: NOT DETECTED
Tetrahydrocannabinol: POSITIVE — AB

## 2019-08-05 LAB — ETHANOL: Alcohol, Ethyl (B): <10 mg/dL (ref <10)

## 2019-08-05 LAB — CBC
HCT: 41.9 % (ref 39.0–52.0)
Hemoglobin: 13.9 g/dL (ref 13.0–17.0)
MCH: 29.3 pg (ref 26.0–34.0)
MCHC: 33.2 g/dL (ref 30.0–36.0)
MCV: 88.4 fL (ref 80.0–100.0)
Platelets: 390 10*3/uL (ref 150–400)
RBC: 4.74 MIL/uL (ref 4.22–5.81)
RDW: 16.6 % — ABNORMAL HIGH (ref 11.5–15.5)
WBC: 6.2 10*3/uL (ref 4.0–10.5)
nRBC: 0 % (ref 0.0–0.2)

## 2019-08-05 LAB — COMPREHENSIVE METABOLIC PANEL
ALT: 2351 U/L — ABNORMAL HIGH (ref 0–44)
AST: 2463 U/L — ABNORMAL HIGH (ref 15–41)
Albumin: 3.5 g/dL (ref 3.5–5.0)
Alkaline Phosphatase: 291 U/L — ABNORMAL HIGH (ref 38–126)
Anion gap: 10 (ref 5–15)
BUN: 12 mg/dL (ref 6–20)
CO2: 29 mmol/L (ref 22–32)
Calcium: 9.5 mg/dL (ref 8.9–10.3)
Chloride: 102 mmol/L (ref 98–111)
Creatinine, Ser: 1.51 mg/dL — ABNORMAL HIGH (ref 0.61–1.24)
GFR calc Af Amer: >60 mL/min (ref >60)
GFR calc non Af Amer: 59 mL/min — ABNORMAL LOW (ref >60)
Glucose, Bld: 94 mg/dL (ref 70–99)
Potassium: 4.2 mmol/L (ref 3.5–5.1)
Sodium: 141 mmol/L (ref 135–145)
Total Bilirubin: 4.9 mg/dL — ABNORMAL HIGH (ref 0.3–1.2)
Total Protein: 8.2 g/dL — ABNORMAL HIGH (ref 6.5–8.1)

## 2019-08-05 LAB — LIPASE, BLOOD: Lipase: 34 U/L (ref 11–51)

## 2019-08-05 LAB — CK: Total CK: 159 U/L (ref 49–397)

## 2019-08-05 LAB — SARS CORONAVIRUS 2 (TAT 6-24 HRS): SARS Coronavirus 2: POSITIVE — AB

## 2019-08-05 LAB — FERRITIN: Ferritin: 844 ng/mL — ABNORMAL HIGH (ref 24–336)

## 2019-08-05 LAB — C-REACTIVE PROTEIN: CRP: 0.9 mg/dL (ref <1.0)

## 2019-08-05 MED ORDER — THIAMINE HCL 100 MG PO TABS
100.0000 mg | ORAL_TABLET | Freq: Every day | ORAL | Status: DC
  Administered 2019-08-05 – 2019-08-09 (×5): 100 mg via ORAL
  Filled 2019-08-05 (×5): qty 1

## 2019-08-05 MED ORDER — SODIUM CHLORIDE 0.9 % IV BOLUS
1000.0000 mL | Freq: Once | INTRAVENOUS | Status: AC
  Administered 2019-08-05: 1000 mL via INTRAVENOUS

## 2019-08-05 MED ORDER — FOLIC ACID 1 MG PO TABS
1.0000 mg | ORAL_TABLET | Freq: Every day | ORAL | Status: DC
  Administered 2019-08-05 – 2019-08-09 (×5): 1 mg via ORAL
  Filled 2019-08-05 (×5): qty 1

## 2019-08-05 MED ORDER — SENNA 8.6 MG PO TABS
1.0000 | ORAL_TABLET | Freq: Two times a day (BID) | ORAL | Status: DC
  Administered 2019-08-05 – 2019-08-07 (×6): 8.6 mg via ORAL
  Filled 2019-08-05 (×8): qty 1

## 2019-08-05 MED ORDER — DEXTROSE-NACL 5-0.45 % IV SOLN: INTRAVENOUS | Status: DC

## 2019-08-05 MED ORDER — AMLODIPINE BESYLATE 10 MG PO TABS
10.0000 mg | ORAL_TABLET | Freq: Every day | ORAL | Status: DC
  Administered 2019-08-05 – 2019-08-09 (×5): 10 mg via ORAL
  Filled 2019-08-05 (×2): qty 1
  Filled 2019-08-05: qty 2
  Filled 2019-08-05 (×2): qty 1

## 2019-08-05 MED ORDER — ADULT MULTIVITAMIN W/MINERALS CH
1.0000 | ORAL_TABLET | Freq: Every day | ORAL | Status: DC
  Administered 2019-08-05 – 2019-08-09 (×5): 1 via ORAL
  Filled 2019-08-05 (×5): qty 1

## 2019-08-05 MED ORDER — IOHEXOL 300 MG/ML  SOLN
80.0000 mL | Freq: Once | INTRAMUSCULAR | Status: AC | PRN
  Administered 2019-08-05: 80 mL via INTRAVENOUS

## 2019-08-05 MED ORDER — ENOXAPARIN SODIUM 40 MG/0.4ML ~~LOC~~ SOLN
40.0000 mg | SUBCUTANEOUS | Status: DC
  Administered 2019-08-05 – 2019-08-07 (×3): 40 mg via SUBCUTANEOUS
  Filled 2019-08-05 (×4): qty 0.4

## 2019-08-05 MED ORDER — HYDRALAZINE HCL 50 MG PO TABS
50.0000 mg | ORAL_TABLET | Freq: Three times a day (TID) | ORAL | Status: DC
  Administered 2019-08-05 – 2019-08-08 (×10): 50 mg via ORAL
  Filled 2019-08-05 (×7): qty 1
  Filled 2019-08-05: qty 2
  Filled 2019-08-05 (×2): qty 1

## 2019-08-05 MED ORDER — IBUPROFEN 400 MG PO TABS: 400.0000 mg | ORAL_TABLET | Freq: Four times a day (QID) | ORAL | Status: DC | PRN

## 2019-08-05 NOTE — ED Notes (Signed)
Report given to rachel, RN on floor

## 2019-08-05 NOTE — Social Work (Addendum)
4:13pm- Was informed by [facility] that pt had tested + for COVID. Had not yet had the change to see this pt in person. Will leave soft hand off for CSW on floor that pt will tx to.   3:35pm- Added Lavinia Sharps, NP with [employer] (resource center for individuals experiencing homelessness, located in [facility]) information to pt discharge instructions, per previous TOC note Camilla, RNCM had spoken with pt about establishing care there. Will f/u w/ pt again.   Octavio Graves, MSW, LCSW [facility] Clinical Social Work

## 2019-08-05 NOTE — ED Notes (Signed)
Pt returned from CT via cart.

## 2019-08-05 NOTE — ED Notes (Signed)
Patient to US

## 2019-08-05 NOTE — ED Notes (Signed)
Pt to CT via cart. Pt conscious, breathing, and A&Ox4. No distress noted.

## 2019-08-05 NOTE — ED Provider Notes (Signed)
[facility] EMERGENCY DEPARTMENT Provider Note   CSN: [id number] Arrival date & time: 08/04/19  2200     History Chief Complaint  Patient presents with  . Abdominal Pain    Albert Blake is a 34 y.o. male.  HPI     This is a 34 year old male with recent history of hepatitis B presents with abdominal pain.  Patient was diagnosed with hepatitis B on July 10, 2019.  History of IV drug abuse and alcohol abuse.  He has not been able to follow-up with gastroenterology.  He presents today with ongoing abdominal pain.  Today he states his pain is in his left lower quadrant.  Pain is worse with certain movements.  Denies nausea, vomiting, diarrhea.  Denies recent alcohol or drug use.  Reports his last alcohol use was over 1 week ago.  He denies fevers.  Denies any medications for his pain as "they told me not to take anything."  Specifically he denies Tylenol use.  Past Medical History:  Diagnosis Date  . Acute hepatitis 07/10/2019    Patient Active Problem List   Diagnosis Date Noted  . Abnormal liver function 07/10/2019  . Rhabdomyolysis 07/10/2019    History reviewed. No pertinent surgical history.     Family History  Problem Relation Age of Onset  . Diabetes Mother     Social History   Tobacco Use  . Smoking status: Current Every Day Smoker    Packs/day: 1.00    Types: Cigarettes  . Smokeless tobacco: Never Used  Substance Use Topics  . Alcohol use: Yes    Comment: pt states, " I drink about 2 bottles a day."  . Drug use: Yes    Types: Cocaine, Marijuana    Home Medications Prior to Admission medications   Medication Sig Start Date End Date Taking? Authorizing Provider  amLODipine (NORVASC) 10 MG tablet Take 1 tablet (10 mg total) by mouth daily. 07/12/19  Yes Glade Lloyd, MD  hydrALAZINE (APRESOLINE) 50 MG tablet Take 1 tablet (50 mg total) by mouth every 8 (eight) hours. Patient not taking: Reported on 07/28/2019 07/11/19   Glade Lloyd, MD    Allergies    Patient has no known allergies.  Review of Systems   Review of Systems  Constitutional: Negative for fever.  Respiratory: Negative for shortness of breath.   Cardiovascular: Negative for chest pain.  Gastrointestinal: Positive for abdominal pain. Negative for diarrhea, nausea and vomiting.  All other systems reviewed and are negative.   Physical Exam Updated Vital Signs BP (!) 170/112   Pulse (!) 57   Temp 98.2 F (36.8 C) (Oral)   Resp 16   SpO2 100%   Physical Exam Vitals and nursing note reviewed.  Constitutional:      Appearance: He is well-developed.  HENT:     Head: Normocephalic and atraumatic.  Eyes:     General: Scleral icterus present.     Pupils: Pupils are equal, round, and reactive to light.  Cardiovascular:     Rate and Rhythm: Normal rate and regular rhythm.     Heart sounds: Normal heart sounds. No murmur.  Pulmonary:     Effort: Pulmonary effort is normal. No respiratory distress.     Breath sounds: Normal breath sounds. No wheezing.  Abdominal:     General: Bowel sounds are normal.     Palpations: Abdomen is soft.     Tenderness: There is abdominal tenderness in the left upper quadrant and left lower quadrant.  There is no guarding or rebound.  Musculoskeletal:     Cervical back: Neck supple.  Lymphadenopathy:     Cervical: No cervical adenopathy.  Skin:    General: Skin is warm and dry.  Neurological:     Mental Status: He is alert and oriented to person, place, and time.  Psychiatric:        Mood and Affect: Mood normal.     ED Results / Procedures / Treatments   Labs (all labs ordered are listed, but only abnormal results are displayed) Labs Reviewed  COMPREHENSIVE METABOLIC PANEL - Abnormal; Notable for the following components:      Result Value   Creatinine, Ser 1.51 (*)    Total Protein 8.2 (*)    AST 2,463 (*)    ALT 2,351 (*)    Alkaline Phosphatase 291 (*)    Total Bilirubin 4.9 (*)    GFR calc  non Af Amer 59 (*)    All other components within normal limits  CBC - Abnormal; Notable for the following components:   RDW 16.6 (*)    All other components within normal limits  URINALYSIS, ROUTINE W REFLEX MICROSCOPIC - Abnormal; Notable for the following components:   Color, Urine AMBER (*)    APPearance HAZY (*)    Bilirubin Urine MODERATE (*)    Protein, ur 100 (*)    All other components within normal limits  ACETAMINOPHEN LEVEL - Abnormal; Notable for the following components:   Acetaminophen (Tylenol), Serum <10 (*)    All other components within normal limits  RAPID URINE DRUG SCREEN, HOSP PERFORMED - Abnormal; Notable for the following components:   Cocaine POSITIVE (*)    Tetrahydrocannabinol POSITIVE (*)    All other components within normal limits  LIPASE, BLOOD  ETHANOL  CK    EKG None  Radiology CT ABDOMEN PELVIS W CONTRAST  Result Date: 08/05/2019 CLINICAL DATA:  Acute abdominal pain and vomiting. EXAM: CT ABDOMEN AND PELVIS WITH CONTRAST TECHNIQUE: Multidetector CT imaging of the abdomen and pelvis was performed using the standard protocol following bolus administration of intravenous contrast. CONTRAST:  35mL OMNIPAQUE IOHEXOL 300 MG/ML  SOLN COMPARISON:  Abdominal ultrasound 07/10/2019 FINDINGS: Lower chest: The lung bases are clear of acute process. No pleural effusion or pulmonary lesions. The heart is normal in size. No pericardial effusion. The distal esophagus and aorta are unremarkable. Hepatobiliary: No focal hepatic lesions or intrahepatic biliary dilatation. Decreased attenuation and slightly heterogeneous appearance of the liver could be due to fatty infiltration or hepatic disease such as hepatitis. The gallbladder is not distended but there is mucosal enhancement and pericholecystic fluid or diffuse edema in the gallbladder wall. No obvious gallstones by CT. No gallstones seen on recent ultrasound either. Normal caliber and course of the common bile duct.  Pancreas: No mass, inflammation or ductal dilatation. Spleen: Normal size. No focal lesions. Adrenals/Urinary Tract: The adrenal glands and kidneys are unremarkable. The bladder is normal. Stomach/Bowel: The stomach, duodenum, small bowel and colon are grossly normal without oral contrast. No inflammatory changes, mass lesions or obstructive findings. The appendix is normal. Vascular/Lymphatic: The aorta is normal in caliber. No dissection. The branch vessels are patent. The major venous structures are patent. No mesenteric or retroperitoneal mass or adenopathy. Small scattered lymph nodes are noted. Reproductive: The prostate gland and seminal vesicles are unremarkable. Other: No pelvic mass or adenopathy. No free pelvic fluid collections. No inguinal mass or adenopathy. No abdominal wall hernia or subcutaneous lesions. Musculoskeletal: No  significant bony findings. Small symmetric largely sclerotic bone lesions in both iliac bones, likely small bone infarcts. IMPRESSION: 1. Thickened gallbladder wall and or pericholecystic fluid could be due to acalculus cholecystitis or underlying parenchymal liver disease and low albumin. 2. Decreased attenuation and slight heterogeneous appearance of the liver may suggest underlying liver disease/hepatitis. 3. No other significant acute abdominal/pelvic findings, mass lesions or adenopathy. Electronically Signed   By: Marijo Sanes M.D.   On: 08/05/2019 05:46    Procedures Procedures (including critical care time)  Medications Ordered in ED Medications  sodium chloride flush (NS) 0.9 % injection 3 mL (has no administration in time range)  sodium chloride 0.9 % bolus 1,000 mL (0 mLs Intravenous Stopped 08/05/19 0512)  iohexol (OMNIPAQUE) 300 MG/ML solution 80 mL (80 mLs Intravenous Contrast Given 08/05/19 0514)    ED Course  I have reviewed the triage vital signs and the nursing notes.  Pertinent labs & imaging results that were available during my care of the  patient were reviewed by me and considered in my medical decision making (see chart for details).    MDM Rules/Calculators/A&P                       She presents with ongoing abdominal pain.  He has persistently elevated LFTs which have rebounded after approaching normal back into the 2000s.  He reports abdominal pain but it is left-sided which is inconsistent with liver pathology.  He is tender on exam without signs of peritonitis.  Denies any ongoing drug use or alcohol abuse.  Vital signs notable for blood pressure of 170/112.  Also with history of rhabdo.  Repeat CK ordered.  CK is normal.  CT abdomen pelvis with thickened gallbladder wall and/or pericholecystic fluid and possible acalculous cholecystitis.  Previously patient had a reassuring right upper quadrant ultrasound.  Given elevated LFTs, feel he warrants readmission for evaluation by GI.  Repeat right upper quadrant ultrasound ordered.  Final Clinical Impression(s) / ED Diagnoses Final diagnoses:  Abdominal pain  Elevated LFTs    Rx / DC Orders ED Discharge Orders    None       Merryl Hacker, MD 08/05/19 [phone]

## 2019-08-05 NOTE — ED Notes (Signed)
Pt back from Ultrasound

## 2019-08-05 NOTE — ED Notes (Signed)
Attempted report 

## 2019-08-05 NOTE — Consult Note (Signed)
UNASSIGNED PATIENT Reason for Consult:  Abnormal LFT's. Referring Physician: TRH.  Albert Blake is an 34 y.o. male.  HPI: GI consultation was requested on Albert Blake, 34 year old male with history of hepatitis B and abdominal pain. Patient was found to be Covid positive and therefore was admitted to 6 W. at [facility]. Patient claims he had some LUQ abdominal pain prompting him to come to the emergency room but the pain has since improved. He has had some problems with constipation but denies having any melena or hematochezia. He admits to using cocaine yesterday and day before yesterday. On his last admission, 3 weeks ago he was found to be positive for acute Hepatitis B and was treated with supportive care.  However since his discharge he has been using cocaine and and his urine toxicology screen is positive for cocaine and THC. He denies any other GI symptoms at this time. His AST was 2463 and ALT was 2351 on admission today along with alkaline phosphatase of 291. His labs were in the similar range on 07/10/2019 when he was in the hospital improved significantly down to an AST of 164 and ALT of 275 with a  Bilirubin of 4.4 and alkaline phosphatase of 147.   Past Medical History:  Diagnosis Date  . Acute Hepatitis B/HTN/Polysubstance abuse. 07/10/2019   History reviewed. No pertinent surgical history.  Family History  Problem Relation Age of Onset  . Diabetes Mother    Social History:  reports that he has been smoking cigarettes. He has been smoking about 1.00 pack per day. He has never used smokeless tobacco. He reports current alcohol use. He reports current drug use. Drugs: Cocaine and Marijuana.  Allergies: No Known Allergies  Medications: I have reviewed the patient's current medications.  Results for orders placed or performed during the hospital encounter of 08/04/19 (from the past 48 hour(s))  Rapid urine drug screen (hospital performed)     Status: Abnormal    Collection Time: 08/04/19  5:39 AM  Result Value Ref Range   Opiates NONE DETECTED NONE DETECTED   Cocaine POSITIVE (A) NONE DETECTED   Benzodiazepines NONE DETECTED NONE DETECTED   Amphetamines NONE DETECTED NONE DETECTED   Tetrahydrocannabinol POSITIVE (A) NONE DETECTED   Barbiturates NONE DETECTED NONE DETECTED    Comment: (NOTE) DRUG SCREEN FOR MEDICAL PURPOSES ONLY.  IF CONFIRMATION IS NEEDED FOR ANY PURPOSE, NOTIFY LAB WITHIN 5 DAYS. LOWEST DETECTABLE LIMITS FOR URINE DRUG SCREEN Drug Class                     Cutoff (ng/mL) Amphetamine and metabolites    1000 Barbiturate and metabolites    200 Benzodiazepine                 200 Tricyclics and metabolites     300 Opiates and metabolites        300 Cocaine and metabolites        300 THC                            50 Performed at [facility], [address], [place], [place] [address]   Urinalysis, Routine w reflex microscopic     Status: Abnormal   Collection Time: 08/04/19 10:38 PM  Result Value Ref Range   Color, Urine AMBER (A) YELLOW    Comment: BIOCHEMICALS MAY BE AFFECTED BY COLOR   APPearance HAZY (A) CLEAR   Specific Gravity,  Urine 1.030 1.005 - 1.030   pH 5.0 5.0 - 8.0   Glucose, UA NEGATIVE NEGATIVE mg/dL   Hgb urine dipstick NEGATIVE NEGATIVE   Bilirubin Urine MODERATE (A) NEGATIVE   Ketones, ur NEGATIVE NEGATIVE mg/dL   Protein, ur 100 (A) NEGATIVE mg/dL   Nitrite NEGATIVE NEGATIVE   Leukocytes,Ua NEGATIVE NEGATIVE   RBC / HPF 0-5 0 - 5 RBC/hpf   WBC, UA 11-20 0 - 5 WBC/hpf   Bacteria, UA NONE SEEN NONE SEEN   Squamous Epithelial / LPF 0-5 0 - 5   Mucus PRESENT    Ca Oxalate Crys, UA PRESENT     Comment: Performed at [facility] [address], [place], [place] [address]  Lipase, blood     Status: None   Collection Time: 08/04/19 10:40 PM  Result Value Ref Range   Lipase 34 11 - 51 U/L    Comment: Performed at [facility] [address], [place], [place] [address]   Comprehensive metabolic panel     Status: Abnormal   Collection Time: 08/04/19 10:40 PM  Result Value Ref Range   Sodium 141 135 - 145 mmol/L   Potassium 4.2 3.5 - 5.1 mmol/L   Chloride 102 98 - 111 mmol/L   CO2 29 22 - 32 mmol/L   Glucose, Bld 94 70 - 99 mg/dL    Comment: Glucose reference range applies only to samples taken after fasting for at least 8 hours.   BUN 12 6 - 20 mg/dL   Creatinine, Ser 1.51 (H) 0.61 - 1.24 mg/dL   Calcium 9.5 8.9 - 10.3 mg/dL   Total Protein 8.2 (H) 6.5 - 8.1 g/dL   Albumin 3.5 3.5 - 5.0 g/dL   AST 2,463 (H) 15 - 41 U/L    Comment: RESULTS CONFIRMED BY MANUAL DILUTION   ALT 2,351 (H) 0 - 44 U/L    Comment: RESULTS CONFIRMED BY MANUAL DILUTION   Alkaline Phosphatase 291 (H) 38 - 126 U/L   Total Bilirubin 4.9 (H) 0.3 - 1.2 mg/dL   GFR calc non Af Amer 59 (L) >60 mL/min   GFR calc Af Amer >60 >60 mL/min   Anion gap 10 5 - 15    Comment: Performed at [facility] [address] [place], [place] [address]  CBC     Status: Abnormal   Collection Time: 08/04/19 10:40 PM  Result Value Ref Range   WBC 6.2 4.0 - 10.5 K/uL   RBC 4.74 4.22 - 5.81 MIL/uL   Hemoglobin 13.9 13.0 - 17.0 g/dL   HCT 41.9 39.0 - 52.0 %   MCV 88.4 80.0 - 100.0 fL   MCH 29.3 26.0 - 34.0 pg   MCHC 33.2 30.0 - 36.0 g/dL   RDW 16.6 (H) 11.5 - 15.5 %   Platelets 390 150 - 400 K/uL   nRBC 0.0 0.0 - 0.2 %    Comment: Performed at [facility], [place] [address] [place], [place] [address]  Ethanol     Status: None   Collection Time: 08/05/19  4:46 AM  Result Value Ref Range   Alcohol, Ethyl (B) <10 <10 mg/dL    Comment: (NOTE) Lowest detectable limit for serum alcohol is 10 mg/dL. For medical purposes only. Performed at [facility], [place] [address], [place], [place] [address]   Acetaminophen level     Status: Abnormal   Collection Time: 08/05/19  4:46 AM  Result Value Ref Range  Acetaminophen (Tylenol), Serum <10 (L) 10 - 30 ug/mL    Comment:  (NOTE) Therapeutic concentrations vary significantly. A range of 10-30 ug/mL  may be an effective concentration for many patients. However, some  are best treated at concentrations outside of this range. Acetaminophen concentrations >150 ug/mL at 4 hours after ingestion  and >50 ug/mL at 12 hours after ingestion are often associated with  toxic reactions. Performed at [facility], [address], [place], [place] [address]   CK     Status: None   Collection Time: 08/05/19  4:47 AM  Result Value Ref Range   Total CK 159 49 - 397 U/L    Comment: Performed at [facility], [address] [place], [place] [address]  SARS CORONAVIRUS 2 (TAT 6-24 HRS) Nasopharyngeal Nasopharyngeal Swab     Status: Abnormal   Collection Time: 08/05/19  7:57 AM   Specimen: Nasopharyngeal Swab  Result Value Ref Range   SARS Coronavirus 2 POSITIVE (A) NEGATIVE    Comment: RESULT CALLED TO, READ BACK BY AND VERIFIED WITH: Leta Baptist RN 14:50 08/05/19 ([email or web address]) (NOTE) SARS-CoV-2 target nucleic acids are DETECTED. The SARS-CoV-2 RNA is generally detectable in upper and lower respiratory specimens during the acute phase of infection. Positive results are indicative of the presence of SARS-CoV-2 RNA. Clinical correlation with patient history and other diagnostic information is  necessary to determine patient infection status. Positive results do not rule out bacterial infection or co-infection with other viruses.  The expected result is Negative. Fact Sheet for Patients: HairSlick.no Fact Sheet for Healthcare Providers: quierodirigir.com This test is not yet approved or cleared by the [place] FDA and  has been authorized for detection and/or diagnosis of SARS-CoV-2 by FDA under an Emergency Use Authorization (EUA). This EUA will remain  in effect (meaning this test can be used) for t he duration of the COVID-19 declaration under Section  564(b)(1) of the Act, 21 U.S.C. section 360bbb-3(b)(1), unless the authorization is terminated or revoked sooner. Performed at [facility], [address] [place], [place] [address]   Protime-INR     Status: Abnormal   Collection Time: 08/05/19  8:17 AM  Result Value Ref Range   Prothrombin Time 16.1 (H) 11.4 - 15.2 seconds   INR 1.3 (H) 0.8 - 1.2    Comment: (NOTE) INR goal varies based on device and disease states. Performed at [facility], [address], [place], [place] [address]     CT ABDOMEN PELVIS W CONTRAST  Result Date: 08/05/2019 CLINICAL DATA:  Acute abdominal pain and vomiting. EXAM: CT ABDOMEN AND PELVIS WITH CONTRAST TECHNIQUE: Multidetector CT imaging of the abdomen and pelvis was performed using the standard protocol following bolus administration of intravenous contrast. CONTRAST:  37mL OMNIPAQUE IOHEXOL 300 MG/ML  SOLN COMPARISON:  Abdominal ultrasound 07/10/2019 FINDINGS: Lower chest: The lung bases are clear of acute process. No pleural effusion or pulmonary lesions. The heart is normal in size. No pericardial effusion. The distal esophagus and aorta are unremarkable. Hepatobiliary: No focal hepatic lesions or intrahepatic biliary dilatation. Decreased attenuation and slightly heterogeneous appearance of the liver could be due to fatty infiltration or hepatic disease such as hepatitis. The gallbladder is not distended but there is mucosal enhancement and pericholecystic fluid or diffuse edema in the gallbladder wall. No obvious gallstones by CT. No gallstones seen on recent ultrasound either. Normal caliber and course of the common bile duct. Pancreas: No mass, inflammation or ductal dilatation.  Spleen: Normal size. No focal lesions. Adrenals/Urinary Tract: The adrenal glands and kidneys are unremarkable. The bladder is normal. Stomach/Bowel: The stomach, duodenum, small bowel and colon are grossly normal without oral contrast. No inflammatory changes, mass  lesions or obstructive findings. The appendix is normal. Vascular/Lymphatic: The aorta is normal in caliber. No dissection. The branch vessels are patent. The major venous structures are patent. No mesenteric or retroperitoneal mass or adenopathy. Small scattered lymph nodes are noted. Reproductive: The prostate gland and seminal vesicles are unremarkable. Other: No pelvic mass or adenopathy. No free pelvic fluid collections. No inguinal mass or adenopathy. No abdominal wall hernia or subcutaneous lesions. Musculoskeletal: No significant bony findings. Small symmetric largely sclerotic bone lesions in both iliac bones, likely small bone infarcts. IMPRESSION: 1. Thickened gallbladder wall and or pericholecystic fluid could be due to acalculus cholecystitis or underlying parenchymal liver disease and low albumin. 2. Decreased attenuation and slight heterogeneous appearance of the liver may suggest underlying liver disease/hepatitis. 3. No other significant acute abdominal/pelvic findings, mass lesions or adenopathy. Electronically Signed   By: Rudie Meyer M.D.   On: 08/05/2019 05:46   US Abdomen Limited RUQ  Result Date: 08/05/2019 CLINICAL DATA:  Abdominal pain EXAM: ULTRASOUND ABDOMEN LIMITED RIGHT UPPER QUADRANT COMPARISON:  CT from yesterday FINDINGS: Gallbladder: Circumferential thickening without striation or pericholecystic edema. No gallstone or focal tenderness Common bile duct: Diameter: 2 mm.  Where visualized, no filling defect. Liver: No focal lesion identified. Within normal limits in parenchymal echogenicity. Portal vein is patent on color Doppler imaging with normal direction of blood flow towards the liver. IMPRESSION: Gallbladder wall thickening without stone or focal tenderness, considered reactive to the patient's hepatitis. Electronically Signed   By: Marnee Spring M.D.   On: 08/05/2019 08:54   Review of Systems  Constitutional: Negative.   HENT: Negative.   Eyes: Negative.    Respiratory: Negative.   Gastrointestinal: Positive for abdominal pain, constipation and nausea. Negative for abdominal distention, anal bleeding, blood in stool and diarrhea.  Endocrine: Negative.   Genitourinary: Negative.   Musculoskeletal: Negative.   Allergic/Immunologic: Negative.   Neurological: Negative.    Blood pressure (!) 161/99, pulse 75, temperature 98 F (36.7 C), temperature source Oral, resp. rate 16, height 6\' 2"  (1.88 m), weight 89.4 kg, SpO2 99 %. Physical Exam  Constitutional: He is oriented to person, place, and time. He appears well-developed and well-nourished.  HENT:  Head: Normocephalic and atraumatic.  Eyes: EOM are normal.  Cardiovascular: Normal rate and regular rhythm.  Respiratory: Effort normal and breath sounds normal.  GI: Soft. Bowel sounds are normal. He exhibits no distension and no mass. There is no abdominal tenderness. There is no rebound and no guarding.  Musculoskeletal:     Cervical back: Normal range of motion and neck supple.  Neurological: He is alert and oriented to person, place, and time.  Skin: Skin is warm and dry.  Psychiatric: He has a normal mood and affect. His behavior is normal. Judgment and thought content normal.   Assessment/Plan: 1) Abnormal LFTs probably secondary to Hepatitis B and polysubstance abuse and COVID-19 infection-continue supportive care 2) LUQ ?Gastritis-It would be best to avoid the use of NSAIDS.  3) Thickened gallbladder wall ?acalculous cholecystitis on CT scan-abdominal ultrasound is pending-I truly doubt he has cholecystitis-he denies having any RUQ pain or nausea. 3) COVID-19 positive.  LFTs can be elevated due to COVID-19 as well. 4) Hypertension.  08/05/2019, 5:29 PM

## 2019-08-05 NOTE — ED Notes (Signed)
hospitalist at bedside

## 2019-08-05 NOTE — Progress Notes (Addendum)
Albert Blake is a 34 y.o. male patient admitted from ED awake, alert - oriented  X 4 - no acute distress noted.  VSS - Blood pressure (!) 162/114, pulse 64, temperature 98.8 F (37.1 C), temperature source Oral, resp. rate 16, height 6\' 2"  (1.88 m), weight 89.4 kg, SpO2 100 %.    IV in place, occlusive dsg intact without redness.  Orientation to room, and floor completed with information packet given to patient/family.  Patient declined safety video at this time.  Admission INP armband ID verified with patient/family, and in place.   Call light within reach, patient able to voice, and demonstrate understanding.  Skin, clean-dry- intact without evidence of bruising, or skin tears.   No evidence of skin break down noted on exam.      , RN 08/05/2019 6:36 PM

## 2019-08-05 NOTE — H&P (Addendum)
History and Physical    Lafayette Dunlevy TKP:[id number] DOB: [birth year] DOA: 08/04/2019  PCP: Patient, No Pcp Per (Confirm with patient/family/NH records and if not entered, this has to be entered at [facility] point of entry) Patient coming from: home  I have personally briefly reviewed patient's old medical records in [place]  Chief Complaint: Abdominal pain  HPI: Albert Blake is a 34 y.o. male with medical history significant of recent history of hepatitis B presents with abdominal pain.  Albert Blake was admitted to [facility] 05/08/19 for heroine detox and hypertension. He had an uneventful course and was discharged with potential for residential treatment.   Patient was diagnosed with hepatitis B on July 10, 2019.  History of IV drug abuse and alcohol abuse.  LFT's were >2,000 and CK was > 1000. He was hypertensive. His course was uneventful. He was seen in consultation by Dr. Benson Norway for GI who recommended outpatient follow-up. At discharge his LFTs were trending down, he did not have encephalopathy or coagulopathy. His BP was better controlled and he was to resume amlodipine 10 mg daily and hydralazine 50 mg tid.  He has not been able to follow-up with gastroenterology due to finanacial reasons. [name]  He presents to [facility] ED earlier today with ongoing abdominal pain.  Today he states his pain is in his left lower quadrant.  Pain is worse with certain movements. He reports that he has had constipation and vomiting of undigested food.  Denies recent alcohol or drug use.  Reports his last alcohol use was over 1 week ago.  He denies fevers.  Denies any medications for his pain as "they told me not to take anything."  Specifically he denies Tylenol use.   Albert Blake reports receiving substance abuse counseling at [place]. He most recently was living in a church [facility] in [place].   (For level 3, the HPI must include 4+ descriptors: Location, Quality,  Severity, Duration, Timing, Context, modifying factors, associated signs/symptoms and/or status of 3+ chronic problems.)  (Please avoid self-populating past medical history here) (The initial 2-3 lines should be focused and good to copy and paste in the HPI section of the daily progress note).  ED Course: Hemodynamically stable except for elevated BP. Lab reveals AST 2,463, ALT 2,361, Alk Phos 291 and mild elevation in total bilirubin. CT revealed thickened GB wall, pericholestatic fluid, diffuse liver changes without lesions. Due to pain and recurrent elevation in LFTs he is referred to [facility] for admission and further evaluation of liver disease and abdominal pain.   Review of Systems: As per HPI otherwise 10 point review of systems negative.  Unacceptable ROS statements: "10 systems reviewed," "Extensive" (without elaboration).  Acceptable ROS statements: "All others negative," "All others reviewed and are negative," and "All others unremarkable," with at [facility] documented Can't double dip - if using for HPI can't use for ROS  Past Medical History:  Diagnosis Date  . Acute hepatitis 07/10/2019    History reviewed. No pertinent surgical history.   Soc Hx - completed 10th grade. He is single, but fathered two children: son age 28, daughter age 48 and he sees them on a regular basis. He lives alone. He has 2 brothers, 2 sisters. His mother lives in [place] and he sees her periodically. 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 He is currently unemployed and uninsured.    reports that he has been smoking cigarettes. He has been smoking about  1.00 pack per day. He has never used smokeless tobacco. He reports current alcohol use. He reports current drug use. Drugs: Cocaine and Marijuana.  No Known Allergies  Family History  Problem Relation Age of Onset  . Diabetes Mother    Unacceptable: Noncontributory, unremarkable, or negative. Acceptable: Family history reviewed and  not pertinent (If you reviewed it)  Prior to Admission medications   Medication Sig Start Date End Date Taking? Authorizing Provider  amLODipine (NORVASC) 10 MG tablet Take 1 tablet (10 mg total) by mouth daily. 07/12/19  Yes Aline August, MD  hydrALAZINE (APRESOLINE) 50 MG tablet Take 1 tablet (50 mg total) by mouth every 8 (eight) hours. Patient not taking: Reported on 07/28/2019 07/11/19   Aline August, MD    Physical Exam: Vitals:   08/04/19 2205 08/05/19 0209 08/05/19 0615  BP: (!) 157/112 (!) 162/108 (!) 170/112  Pulse: 82 68 (!) 57  Resp: 16 16   Temp: 98.2 F (36.8 C)    TempSrc: Oral    SpO2: 100% 99% 100%    Constitutional: NAD, calm, comfortable Vitals:   08/04/19 2205 08/05/19 0209 08/05/19 0615  BP: (!) 157/112 (!) 162/108 (!) 170/112  Pulse: 82 68 (!) 57  Resp: 16 16   Temp: 98.2 F (36.8 C)    TempSrc: Oral    SpO2: 100% 99% 100%   General:  WNWD man in no distress] Eyes: PERRL, lids and conjunctivae normal w/o icterus ENMT: Mucous membranes are moist. Posterior pharynx clear of any exudate or lesions.Normal dentition.  Neck: normal, supple, no masses, no thyromegaly Respiratory: clear to auscultation bilaterally, no wheezing, no crackles. Normal respiratory effort. No accessory muscle use.  Cardiovascular: Regular rate and rhythm, no murmurs / rubs / gallops. No extremity edema. 2+ pedal pulses. No carotid bruits.  Abdomen: No hepatosplenomegaly.Tender to deep palpation and percussion in the RUQ. Bowel sounds positive.  Musculoskeletal: no clubbing / cyanosis. No joint deformity upper and lower extremities. Good ROM, no contractures. Normal muscle tone.  Skin: no rashes, lesions, ulcers. No induration Neurologic: CN 2-12 grossly intact. Sensation intact, DTR normal. Strength 5/5 in all 4.  Psychiatric: Normal judgment and insight. Alert and oriented x 3. Normal mood.   (Anything < 9 systems with 2 bullets each down codes to level 1) (If patient refuses  exam can't bill higher level) (Make sure to document decubitus ulcers present on admission -- if possible -- and whether patient has chronic indwelling catheter at time of admission)  Labs on Admission: I have personally reviewed following labs and imaging studies  CBC: Recent Labs  Lab 08/04/19 2240  WBC 6.2  HGB 13.9  HCT 41.9  MCV 88.4  PLT 850   Basic Metabolic Panel: Recent Labs  Lab 08/04/19 2240  NA 141  K 4.2  CL 102  CO2 29  GLUCOSE 94  BUN 12  CREATININE 1.51*  CALCIUM 9.5   GFR: Estimated Creatinine Clearance: 80.1 mL/min (A) (by C-G formula based on SCr of 1.51 mg/dL (H)). Liver Function Tests: Recent Labs  Lab 08/04/19 2240  AST 2,463*  ALT 2,351*  ALKPHOS 291*  BILITOT 4.9*  PROT 8.2*  ALBUMIN 3.5   Recent Labs  Lab 08/04/19 2240  LIPASE 34   No results for input(s): AMMONIA in the last 168 hours. Coagulation Profile: No results for input(s): INR, PROTIME in the last 168 hours. Cardiac Enzymes: Recent Labs  Lab 08/05/19 0447  CKTOTAL 159   BNP (last 3 results) No results for input(s):  PROBNP in the last 8760 hours. HbA1C: No results for input(s): HGBA1C in the last 72 hours. CBG: No results for input(s): GLUCAP in the last 168 hours. Lipid Profile: No results for input(s): CHOL, HDL, LDLCALC, TRIG, CHOLHDL, LDLDIRECT in the last 72 hours. Thyroid Function Tests: No results for input(s): TSH, T4TOTAL, FREET4, T3FREE, THYROIDAB in the last 72 hours. Anemia Panel: No results for input(s): VITAMINB12, FOLATE, FERRITIN, TIBC, IRON, RETICCTPCT in the last 72 hours. Urine analysis:    Component Value Date/Time   COLORURINE AMBER (A) 08/04/2019 2238   APPEARANCEUR HAZY (A) 08/04/2019 2238   LABSPEC 1.030 08/04/2019 2238   PHURINE 5.0 08/04/2019 2238   GLUCOSEU NEGATIVE 08/04/2019 2238   HGBUR NEGATIVE 08/04/2019 2238   BILIRUBINUR MODERATE (A) 08/04/2019 2238   [place] 08/04/2019 2238   PROTEINUR 100 (A) 08/04/2019 2238    NITRITE NEGATIVE 08/04/2019 2238   LEUKOCYTESUR NEGATIVE 08/04/2019 2238    Radiological Exams on Admission: CT ABDOMEN PELVIS W CONTRAST  Result Date: 08/05/2019 CLINICAL DATA:  Acute abdominal pain and vomiting. EXAM: CT ABDOMEN AND PELVIS WITH CONTRAST TECHNIQUE: Multidetector CT imaging of the abdomen and pelvis was performed using the standard protocol following bolus administration of intravenous contrast. CONTRAST:  79m OMNIPAQUE IOHEXOL 300 MG/ML  SOLN COMPARISON:  Abdominal ultrasound 07/10/2019 FINDINGS: Lower chest: The lung bases are clear of acute process. No pleural effusion or pulmonary lesions. The heart is normal in size. No pericardial effusion. The distal esophagus and aorta are unremarkable. Hepatobiliary: No focal hepatic lesions or intrahepatic biliary dilatation. Decreased attenuation and slightly heterogeneous appearance of the liver could be due to fatty infiltration or hepatic disease such as hepatitis. The gallbladder is not distended but there is mucosal enhancement and pericholecystic fluid or diffuse edema in the gallbladder wall. No obvious gallstones by CT. No gallstones seen on recent ultrasound either. Normal caliber and course of the common bile duct. Pancreas: No mass, inflammation or ductal dilatation. Spleen: Normal size. No focal lesions. Adrenals/Urinary Tract: The adrenal glands and kidneys are unremarkable. The bladder is normal. Stomach/Bowel: The stomach, duodenum, small bowel and colon are grossly normal without oral contrast. No inflammatory changes, mass lesions or obstructive findings. The appendix is normal. Vascular/Lymphatic: The aorta is normal in caliber. No dissection. The branch vessels are patent. The major venous structures are patent. No mesenteric or retroperitoneal mass or adenopathy. Small scattered lymph nodes are noted. Reproductive: The prostate gland and seminal vesicles are unremarkable. Other: No pelvic mass or adenopathy. No free pelvic  fluid collections. No inguinal mass or adenopathy. No abdominal wall hernia or subcutaneous lesions. Musculoskeletal: No significant bony findings. Small symmetric largely sclerotic bone lesions in both iliac bones, likely small bone infarcts. IMPRESSION: 1. Thickened gallbladder wall and or pericholecystic fluid could be due to acalculus cholecystitis or underlying parenchymal liver disease and low albumin. 2. Decreased attenuation and slight heterogeneous appearance of the liver may suggest underlying liver disease/hepatitis. 3. No other significant acute abdominal/pelvic findings, mass lesions or adenopathy. Electronically Signed   By: PMarijo SanesM.D.   On: 08/05/2019 05:46    EKG: Independently reviewed. No EKG done  Assessment/Plan Active Problems:   Hep B w/o coma   Abdominal pain   Essential hypertension   Polysubstance (excluding opioids) dependence (HKaleva  (please populate well all problems here in Problem List. (For example, if patient is on BP meds at home and you resume or decide to hold them, it is a problem that needs to be her. Same  for CAD, COPD, HLD and so on)   1. Hep B w/o coma - patient with recurrent hepatic inflammation with LFTs>2,000. Plan Supportive care: regular diet, IVF D51/2NS at 73m/hr.  Reconsult by GI - Dr. HUlyses Amorservice  F/u lab in AM  2. Abdominal pain - CT suggests possible acalculus cholecystitis. U/S pending Plan Mild analgesia with NSAIDs, ketorolac if poor control with Ibuprofen.  May need surgical consult if continued suspicion for cholecystitis  3. HTN-  Continue amolodipine 10 mg/d and hydralazine 50 mg tid  4. Polysubstance abuse, need for PCP and financial assistance - [facility]   5. Covid 19 positive - patient had routine admission covid testing which came back positive. He is asymptomatic except for abdominal pain and N/V. No fever, no respiratory sypmtoms. Does not explain hepatitis Plan Will order inflammatory  markers  Changed precautions to airborne  DVT prophylaxis: lovenox (Lovenox/Heparin/SCD's/anticoagulated/None (if comfort care) Code Status: full (Full/Partial (specify details) Family Communication: spoke w/ Albert Blake SO. Explained admit and plan. No questions. (Specify name, relationship. Do not write "discussed with patient". Specify tel # if discussed over the phone) Disposition Plan: home when medically stable 24-48 hours  (specify when and where you expect patient to be discharged) Consults called: GI -  Spoke with Dr. HBenson Norwaywho will see patient later. No further suggestions other than above. (with names) Admission status: observation (inpatient / obs / tele / medical floor / SDU)   MAdella HareMD [facility] Pager [phone] If 7PM-7AM, please contact night-coverage [email or web address] Password [facility] 08/05/2019, 7:44 AM

## 2019-08-05 NOTE — Plan of Care (Signed)

## 2019-08-06 DIAGNOSIS — R7989 Other specified abnormal findings of blood chemistry: Secondary | ICD-10-CM

## 2019-08-06 LAB — COMPREHENSIVE METABOLIC PANEL
ALT: 1920 U/L — ABNORMAL HIGH (ref 0–44)
AST: 1718 U/L — ABNORMAL HIGH (ref 15–41)
Albumin: 3.1 g/dL — ABNORMAL LOW (ref 3.5–5.0)
Alkaline Phosphatase: 274 U/L — ABNORMAL HIGH (ref 38–126)
Anion gap: 13 (ref 5–15)
BUN: 10 mg/dL (ref 6–20)
CO2: 21 mmol/L — ABNORMAL LOW (ref 22–32)
Calcium: 9.1 mg/dL (ref 8.9–10.3)
Chloride: 103 mmol/L (ref 98–111)
Creatinine, Ser: 1.32 mg/dL — ABNORMAL HIGH (ref 0.61–1.24)
GFR calc Af Amer: >60 mL/min (ref >60)
GFR calc non Af Amer: >60 mL/min (ref >60)
Glucose, Bld: 87 mg/dL (ref 70–99)
Potassium: 3.6 mmol/L (ref 3.5–5.1)
Sodium: 137 mmol/L (ref 135–145)
Total Bilirubin: 4.8 mg/dL — ABNORMAL HIGH (ref 0.3–1.2)
Total Protein: 7.4 g/dL (ref 6.5–8.1)

## 2019-08-06 MED ORDER — ONDANSETRON HCL 4 MG PO TABS
4.0000 mg | ORAL_TABLET | Freq: Three times a day (TID) | ORAL | Status: DC | PRN
  Administered 2019-08-06: 4 mg via ORAL
  Filled 2019-08-06: qty 1

## 2019-08-06 NOTE — Progress Notes (Signed)
PROGRESS NOTE                                                                                                                                                                                                             Patient Demographics:    Albert Blake, is a 34 y.o. male, DOB - 02-08-1986, YIR:[id number]  Admit date - 08/04/2019   Admitting Physician Starleen Arms, MD  Outpatient Primary MD for the patient is Patient, No Pcp Per  LOS - 0   Chief Complaint  Patient presents with  . Abdominal Pain       Brief Narrative    Albert Blake is a 34 y.o. male with medical history significant of recent history of hepatitis B presents with abdominal pain. Albert Blake was admitted to [facility] 05/08/19 for heroine detox and hypertension. He had an uneventful course and was discharged with potential for residential treatment.   Patient was diagnosed with hepatitis B on July 10, 2019. History of IV drug abuse and alcohol abuse. LFT's were >2,000 and CK was > 1000. He was hypertensive. His course was uneventful. He was seen in consultation by Dr. Elnoria Howard for GI who recommended outpatient follow-up. At discharge his LFTs were trending down, he did not have encephalopathy or coagulopathy. His BP was better controlled and he was to resume amlodipine 10 mg daily and hydralazine 50 mg tid.  He has not been able to follow-up with gastroenterology due to finanacial reasons. [name] He presents to [facility] ED earlier today with ongoing abdominal pain. Today he states his pain is in his left lower quadrant. Pain is worse with certain movements. He reports that he has had constipation and vomiting of undigested food. Denies recent alcohol or drug use. Reports his last alcohol use was over 1 week ago. He denies fevers. Denies any medications for his pain as "they told me not to take anything." Specifically he denies Tylenol use.    Subjective:      Albert Blake today has, No headache, No chest pain, No abdominal pain - No Nausea, No new weakness tingling or numbness, No Cough - SOB.    Assessment  & Plan :    Active Problems:   Hep B w/o coma   Essential hypertension   Polysubstance (excluding opioids) dependence (HCC)   Abdominal pain  Elevated LFTs  -This is most likely due to hepatitis B infection, as well likely polysubstance abuse contributing to weight, and COVID-19 infection will be causing elevated LFTs . -Patient with thickened gall bladder wall, questionable calculus cholecystitis on CT abdomen, bladder, will with no leukocytosis, no nausea or vomiting, no right upper quadrant pain, so cholecystitis is unlikely, follow on ultrasound.  HTN -  Continue amolodipine 10 mg/d and hydralazine 50 mg tid  Polysubstance abuse, cocaine/THC -He was counseled  - referral made to [facility] team.   COVID-19 infection -So far this appears to be asymptomatic, elevated LFTs can be attributed to COVID-19 infection as well, monitor clinically, so far no indication for treatment.   COVID-19 Labs  Recent Labs    08/05/19 1800  FERRITIN 844*  CRP 0.9    Lab Results  Component Value Date   SARSCOV2NAA POSITIVE (A) 08/05/2019   SARSCOV2NAA NEGATIVE 07/10/2019     Code Status : Full  Family Communication  : Patient is coherent, awake alert discussed with patient  Disposition Plan  : Home  Barriers For Discharge : Remains with significantly elevated LFTs thousands range, high risk for developing acute decompensated liver failure, will need close monitoring they are trending down before he stable for discharge  Consults  :  GI  Procedures  : None  DVT Prophylaxis  :  [place] lovenox  Lab Results  Component Value Date   PLT 390 08/04/2019    Antibiotics  :    Anti-infectives (From admission, onward)   None        Objective:   Vitals:   08/05/19 1700 08/05/19 1952 08/06/19 0623 08/06/19 1251  BP: (!)  162/114 (!) 161/97 (!) 164/118 (!) 144/97  Pulse: 64 74 (!) 55 66  Resp:  16 16 18   Temp: 98.8 F (37.1 C) 98.7 F (37.1 C) 98.4 F (36.9 C) 98.4 F (36.9 C)  TempSrc: Oral Oral Oral Oral  SpO2: 100% 100% 100%   Weight:      Height:        Wt Readings from Last 3 Encounters:  08/05/19 89.4 kg  07/28/19 89.4 kg  07/11/19 78.7 kg     Intake/Output Summary (Last 24 hours) at 08/06/2019 1413 Last data filed at 08/06/2019 1300 Gross per 24 hour  Intake 480 ml  Output --  Net 480 ml     Physical Exam  Awake Alert, Oriented X 3, No new F.N deficits, Normal affect Symmetrical Chest wall movement, Good air movement bilaterally, CTAB RRR,No Gallops,Rubs or new Murmurs, No Parasternal Heave +ve B.Sounds, Abd Soft, No tenderness, No rebound - guarding or rigidity. No Cyanosis, Clubbing or edema, No new Rash or bruise      Data Review:    CBC Recent Labs  Lab 08/04/19 2240  WBC 6.2  HGB 13.9  HCT 41.9  PLT 390  MCV 88.4  MCH 29.3  MCHC 33.2  RDW 16.6*    Chemistries  Recent Labs  Lab 08/04/19 2240 08/06/19 0238  NA 141 137  K 4.2 3.6  CL 102 103  CO2 29 21*  GLUCOSE 94 87  BUN 12 10  CREATININE 1.51* 1.32*  CALCIUM 9.5 9.1  AST 2,463* 1,718*  ALT 2,351* 1,920*  ALKPHOS 291* 274*  BILITOT 4.9* 4.8*   ------------------------------------------------------------------------------------------------------------------ No results for input(s): CHOL, HDL, LDLCALC, TRIG, CHOLHDL, LDLDIRECT in the last 72 hours.  No results found for: HGBA1C ------------------------------------------------------------------------------------------------------------------ No results for input(s): TSH, T4TOTAL, T3FREE, THYROIDAB in the last 72  hours.  Invalid input(s): FREET3 ------------------------------------------------------------------------------------------------------------------ Recent Labs    08/05/19 1800  FERRITIN 844*    Coagulation profile Recent Labs   Lab 08/05/19 0817  INR 1.3*    No results for input(s): DDIMER in the last 72 hours.  Cardiac Enzymes No results for input(s): CKMB, TROPONINI, MYOGLOBIN in the last 168 hours.  Invalid input(s): CK ------------------------------------------------------------------------------------------------------------------ No results found for: BNP  Inpatient Medications  Scheduled Meds: . amLODipine  10 mg Oral Daily  . enoxaparin (LOVENOX) injection  40 mg Subcutaneous Q24H  . folic acid  1 mg Oral Daily  . hydrALAZINE  50 mg Oral Q8H  . multivitamin with minerals  1 tablet Oral Daily  . senna  1 tablet Oral BID  . sodium chloride flush  3 mL Intravenous Once  . thiamine  100 mg Oral Daily   Continuous Infusions: . dextrose 5 % and 0.45% NaCl 75 mL/hr at 08/05/19 0852   PRN Meds:.ibuprofen  Micro Results Recent Results (from the past 240 hour(s))  SARS CORONAVIRUS 2 (TAT 6-24 HRS) Nasopharyngeal Nasopharyngeal Swab     Status: Abnormal   Collection Time: 08/05/19  7:57 AM   Specimen: Nasopharyngeal Swab  Result Value Ref Range Status   SARS Coronavirus 2 POSITIVE (A) NEGATIVE Final    Comment: RESULT CALLED TO, READ BACK BY AND VERIFIED WITH: Leta Baptist RN 14:50 08/05/19 ([email or web address]) (NOTE) SARS-CoV-2 target nucleic acids are DETECTED. The SARS-CoV-2 RNA is generally detectable in upper and lower respiratory specimens during the acute phase of infection. Positive results are indicative of the presence of SARS-CoV-2 RNA. Clinical correlation with patient history and other diagnostic information is  necessary to determine patient infection status. Positive results do not rule out bacterial infection or co-infection with other viruses.  The expected result is Negative. Fact Sheet for Patients: HairSlick.no Fact Sheet for Healthcare Providers: quierodirigir.com This test is not yet approved or cleared by the [place] FDA  and  has been authorized for detection and/or diagnosis of SARS-CoV-2 by FDA under an Emergency Use Authorization (EUA). This EUA will remain  in effect (meaning this test can be used) for t he duration of the COVID-19 declaration under Section 564(b)(1) of the Act, 21 U.S.C. section 360bbb-3(b)(1), unless the authorization is terminated or revoked sooner. Performed at [facility], [address] [place], [place] [address]     Radiology Reports CT ABDOMEN PELVIS W CONTRAST  Result Date: 08/05/2019 CLINICAL DATA:  Acute abdominal pain and vomiting. EXAM: CT ABDOMEN AND PELVIS WITH CONTRAST TECHNIQUE: Multidetector CT imaging of the abdomen and pelvis was performed using the standard protocol following bolus administration of intravenous contrast. CONTRAST:  62mL OMNIPAQUE IOHEXOL 300 MG/ML  SOLN COMPARISON:  Abdominal ultrasound 07/10/2019 FINDINGS: Lower chest: The lung bases are clear of acute process. No pleural effusion or pulmonary lesions. The heart is normal in size. No pericardial effusion. The distal esophagus and aorta are unremarkable. Hepatobiliary: No focal hepatic lesions or intrahepatic biliary dilatation. Decreased attenuation and slightly heterogeneous appearance of the liver could be due to fatty infiltration or hepatic disease such as hepatitis. The gallbladder is not distended but there is mucosal enhancement and pericholecystic fluid or diffuse edema in the gallbladder wall. No obvious gallstones by CT. No gallstones seen on recent ultrasound either. Normal caliber and course of the common bile duct. Pancreas: No mass, inflammation or ductal dilatation. Spleen: Normal size. No focal lesions. Adrenals/Urinary Tract: The adrenal glands and kidneys are unremarkable. The bladder is normal.  Stomach/Bowel: The stomach, duodenum, small bowel and colon are grossly normal without oral contrast. No inflammatory changes, mass lesions or obstructive findings. The appendix is normal.  Vascular/Lymphatic: The aorta is normal in caliber. No dissection. The branch vessels are patent. The major venous structures are patent. No mesenteric or retroperitoneal mass or adenopathy. Small scattered lymph nodes are noted. Reproductive: The prostate gland and seminal vesicles are unremarkable. Other: No pelvic mass or adenopathy. No free pelvic fluid collections. No inguinal mass or adenopathy. No abdominal wall hernia or subcutaneous lesions. Musculoskeletal: No significant bony findings. Small symmetric largely sclerotic bone lesions in both iliac bones, likely small bone infarcts. IMPRESSION: 1. Thickened gallbladder wall and or pericholecystic fluid could be due to acalculus cholecystitis or underlying parenchymal liver disease and low albumin. 2. Decreased attenuation and slight heterogeneous appearance of the liver may suggest underlying liver disease/hepatitis. 3. No other significant acute abdominal/pelvic findings, mass lesions or adenopathy. Electronically Signed   By: Marijo Sanes M.D.   On: 08/05/2019 05:46   US Abdomen Limited RUQ  Result Date: 08/05/2019 CLINICAL DATA:  Abdominal pain EXAM: ULTRASOUND ABDOMEN LIMITED RIGHT UPPER QUADRANT COMPARISON:  CT from yesterday FINDINGS: Gallbladder: Circumferential thickening without striation or pericholecystic edema. No gallstone or focal tenderness Common bile duct: Diameter: 2 mm.  Where visualized, no filling defect. Liver: No focal lesion identified. Within normal limits in parenchymal echogenicity. Portal vein is patent on color Doppler imaging with normal direction of blood flow towards the liver. IMPRESSION: Gallbladder wall thickening without stone or focal tenderness, considered reactive to the patient's hepatitis. Electronically Signed   By: Monte Fantasia M.D.   On: 08/05/2019 08:54   US Abdomen Limited RUQ  Result Date: 07/10/2019 CLINICAL DATA:  Elevated LFTs.  Acute hepatitis. EXAM: ULTRASOUND ABDOMEN LIMITED RIGHT UPPER  QUADRANT COMPARISON:  None. FINDINGS: Gallbladder: The gallbladder is suboptimally evaluated as the patient is not NPO. Mild apparent gallbladder wall thickening, potentially accentuated due to underdistention due to non NPO status. No echogenic gallstones or biliary sludge. No definitive pericholecystic fluid. Negative sonographic Murphy sign. Common bile duct: Diameter: Normal in size measuring 3 mm in diameter Liver: Questioned increased echogenicity about portal triad (starry sky appearance). Otherwise, normal echogenicity of the hepatic parenchyma. Question minimal amount of intrahepatic biliary ductal dilatation (representative image 39. No discrete hepatic lesions. Portal vein is patent on color Doppler imaging with normal direction of blood flow towards the liver. No ascites. Other: Incidentally noted diffuse increased echogenicity of the incidentally imaged right kidney without evidence of urinary obstruction (representative image 32). IMPRESSION: 1. Questioned "starry sky" appearance of the hepatic parenchyma as could be seen in the setting of an acute hepatitis. No discrete hepatic lesions. No ascites. 2. Suspected minimal intrahepatic biliary duct dilatation. 3. Apparent gallbladder wall thickening, potentially accentuated due to patient's non NPO status. No echogenic stones or biliary sludge. Negative sonographic Murphy's sign. 4. Increased echogenicity of the right kidney without evidence of urinary obstruction, nonspecific though could be seen in the setting medical renal disease. Electronically Signed   By: Sandi Mariscal M.D.   On: 07/10/2019 07:55      Phillips Climes M.D on 08/06/2019 at 2:13 PM  Between 7am to 7pm - Pager - [phone]  After 7pm go to [email or web address] - password [facility]  [facility] -  Office  [phone]

## 2019-08-06 NOTE — TOC Initial Note (Signed)
Transition of Care [facility]) - Initial/Assessment Note    Patient Details  Name: Albert Blake MRN: [id number] Date of Birth: 04-Dec-1985  Transition of Care [facility]) CM/SW Contact:    Ranelle Oyster, Student-Social Work Phone Number: 08/06/2019, 11:16 AM  Clinical Narrative:                 MSW spoke with patient and initiated small assessment. Patient explained to MSW that he is homeless and has been homeless for about 4 months. MSW informed patient about COVID hotel option, and asked if he wanted to be brought to the hotel upon discharge. Pt agreed and MSW updated him on the referral process and said that he would need to do paperwork upon approval. Pt agreed and was hopeful about this option. CSW left voicemail for Covid hotel program coordinator and is awaiting a call back. Pt declined substance use and other housing resources, but needed clothing. MSW will see if there is any clothes his size in the clothing closet at the hospital. MSW and CSW will continue to follow up.   Expected Discharge Plan: Home/Self Care Barriers to Discharge: Continued Medical Work up   Patient Goals and CMS Choice        Expected Discharge Plan and Services Expected Discharge Plan: Home/Self Care                                              Prior Living Arrangements/Services                       Activities of Daily Living Home Assistive Devices/Equipment: None ADL Screening (condition at time of admission) Patient's cognitive ability adequate to safely complete daily activities?: Yes Is the patient deaf or have difficulty hearing?: No Does the patient have difficulty seeing, even when wearing glasses/contacts?: No Does the patient have difficulty concentrating, remembering, or making decisions?: No Patient able to express need for assistance with ADLs?: Yes Does the patient have difficulty dressing or bathing?: No Independently performs ADLs?: Yes (appropriate for developmental  age) Does the patient have difficulty walking or climbing stairs?: No Weakness of Legs: None Weakness of Arms/Hands: None  Permission Sought/Granted                  Emotional Assessment              Admission diagnosis:  Hep B w/o coma [B19.10] Elevated LFTs [R79.89] Abdominal pain [R10.9] Patient Active Problem List   Diagnosis Date Noted  . Hep B w/o coma 08/05/2019  . Essential hypertension 08/05/2019  . Polysubstance (excluding opioids) dependence (HCC) 08/05/2019  . Abdominal pain 08/05/2019  . Abnormal liver function 07/10/2019  . Rhabdomyolysis 07/10/2019   PCP:  Patient, No Pcp Per Pharmacy:   [phone] [place], [place] - [address] [place] [address] Phone: [phone] Fax: [phone]  [facility] [place], [place] - [address] [place] [address] Phone: [phone] Fax: [phone]  [id number] [place], [place] - [address] AT [facility] [address] [place] [address] Phone: [phone] Fax: [phone]     Social Determinants of Health (SDOH) Interventions    Readmission Risk Interventions No flowsheet data found.

## 2019-08-06 NOTE — TOC Initial Note (Addendum)
Transition of Care [facility]) - Initial/Assessment Note    Patient Details  Name: Albert Blake MRN: [id number] Date of Birth: 1985/10/03  Transition of Care [facility]) CM/SW Contact:    Albert Sabal, RN Phone Number: 08/06/2019, 10:05 AM  Clinical Narrative:             Albert Blake w patient over the phone. He states that he has not yet followed up with [facility] as recommended at time DC last week from ED.  I left a VM w Albert Blake of [facility] requesting callback. Albert Abrahams called back, and was notified of patient.  He states that his friend Albert Blake at [phone] is his point of contact and can get a hold of him. He states that he does not have a cell phone. He confirms that he does not have any problems with ambulation, and relies on public transport if needed. He states that he sleeps in no particular place, sometimes in [place] trucks.  Patient is currently on RA.  Appointment at post acute covid care center made. Will need assessed for MATCH and TOC pharmacy at DC.        Expected Discharge Plan: Home/Self Care Barriers to Discharge: Continued Medical Work up   Patient Goals and CMS Choice        Expected Discharge Plan and Services Expected Discharge Plan: Home/Self Care                                              Prior Living Arrangements/Services                       Activities of Daily Living Home Assistive Devices/Equipment: None ADL Screening (condition at time of admission) Patient's cognitive ability adequate to safely complete daily activities?: Yes Is the patient deaf or have difficulty hearing?: No Does the patient have difficulty seeing, even when wearing glasses/contacts?: No Does the patient have difficulty concentrating, remembering, or making decisions?: No Patient able to express need for assistance with ADLs?: Yes Does the patient have difficulty dressing or bathing?: No Independently performs ADLs?: Yes (appropriate for developmental  age) Does the patient have difficulty walking or climbing stairs?: No Weakness of Legs: None Weakness of Arms/Hands: None  Permission Sought/Granted                  Emotional Assessment              Admission diagnosis:  Hep B w/o coma [B19.10] Elevated LFTs [R79.89] Abdominal pain [R10.9] Patient Active Problem List   Diagnosis Date Noted  . Hep B w/o coma 08/05/2019  . Essential hypertension 08/05/2019  . Polysubstance (excluding opioids) dependence (HCC) 08/05/2019  . Abdominal pain 08/05/2019  . Abnormal liver function 07/10/2019  . Rhabdomyolysis 07/10/2019   PCP:  Patient, No Pcp Per Pharmacy:   [facility] [phone] [place], [place] - [address] [place] [address] Phone: [phone] Fax: [phone]  [facility] [place], [place] - [address] [place] [address] Phone: [phone] Fax: [phone]  [id number] [place], [place] - [address] AT [facility] [address] [place] [address] Phone: [phone] Fax: [phone]     Social Determinants of Health (SDOH) Interventions    Readmission  Risk Interventions No flowsheet data found.

## 2019-08-07 LAB — COMPREHENSIVE METABOLIC PANEL
ALT: 1539 U/L — ABNORMAL HIGH (ref 0–44)
AST: 1191 U/L — ABNORMAL HIGH (ref 15–41)
Albumin: 3 g/dL — ABNORMAL LOW (ref 3.5–5.0)
Alkaline Phosphatase: 270 U/L — ABNORMAL HIGH (ref 38–126)
Anion gap: 10 (ref 5–15)
BUN: 11 mg/dL (ref 6–20)
CO2: 25 mmol/L (ref 22–32)
Calcium: 9.2 mg/dL (ref 8.9–10.3)
Chloride: 104 mmol/L (ref 98–111)
Creatinine, Ser: 1.09 mg/dL (ref 0.61–1.24)
GFR calc Af Amer: >60 mL/min (ref >60)
GFR calc non Af Amer: >60 mL/min (ref >60)
Glucose, Bld: 92 mg/dL (ref 70–99)
Potassium: 3.6 mmol/L (ref 3.5–5.1)
Sodium: 139 mmol/L (ref 135–145)
Total Bilirubin: 4.3 mg/dL — ABNORMAL HIGH (ref 0.3–1.2)
Total Protein: 7.5 g/dL (ref 6.5–8.1)

## 2019-08-07 LAB — CBC
HCT: 40 % (ref 39.0–52.0)
Hemoglobin: 13.4 g/dL (ref 13.0–17.0)
MCH: 29 pg (ref 26.0–34.0)
MCHC: 33.5 g/dL (ref 30.0–36.0)
MCV: 86.6 fL (ref 80.0–100.0)
Platelets: 301 10*3/uL (ref 150–400)
RBC: 4.62 MIL/uL (ref 4.22–5.81)
RDW: 16.5 % — ABNORMAL HIGH (ref 11.5–15.5)
WBC: 6.3 10*3/uL (ref 4.0–10.5)
nRBC: 0 % (ref 0.0–0.2)

## 2019-08-07 MED ORDER — THIAMINE HCL 100 MG PO TABS: 100.0000 mg | ORAL_TABLET | Freq: Every day | ORAL | 0 refills | Status: AC

## 2019-08-07 MED ORDER — HYDRALAZINE HCL 50 MG PO TABS: 50.0000 mg | ORAL_TABLET | Freq: Three times a day (TID) | ORAL | 0 refills | Status: DC

## 2019-08-07 MED ORDER — AMLODIPINE BESYLATE 10 MG PO TABS: 10.0000 mg | ORAL_TABLET | Freq: Every day | ORAL | 0 refills | Status: AC

## 2019-08-07 MED ORDER — FOLIC ACID 1 MG PO TABS: 1.0000 mg | ORAL_TABLET | Freq: Every day | ORAL | 0 refills | Status: AC

## 2019-08-07 MED FILL — AMLODIPINE BESYLATE 10 MG T: 10 | 30 days supply | Qty: 30 | Fill #0

## 2019-08-07 MED FILL — FOLIC ACID 1 MG TABS: 1 | 30 days supply | Qty: 30 | Fill #0

## 2019-08-07 MED FILL — hydrALAZINE HCL 50 MG TABS: 50 | 30 days supply | Qty: 90 | Fill #0

## 2019-08-07 MED FILL — VITAMIN B-1 100 MG TABS: 100 | 30 days supply | Qty: 30 | Fill #0

## 2019-08-07 NOTE — Progress Notes (Signed)
PROGRESS NOTE                                                                                                                                                                                                             Patient Demographics:    Albert Blake, is a 34 y.o. male, DOB - 1985/06/14, HWE:[id number]  Admit date - 08/04/2019   Admitting Physician [name], MD  Outpatient Primary MD for the patient is Patient, No Pcp Per  LOS - 1   Chief Complaint  Patient presents with  . Abdominal Pain       Brief Narrative    Albert Blake is a 34 y.o. male with medical history significant of recent history of hepatitis B presents with abdominal pain. Mr. Albert Blake was admitted to [facility] 05/08/19 for heroine detox and hypertension. He had an uneventful course and was discharged with potential for residential treatment.   Patient was diagnosed with hepatitis B on July 10, 2019. History of IV drug abuse and alcohol abuse. LFT's were >2,000 and CK was > 1000. He was hypertensive. His course was uneventful. He was seen in consultation by Dr. Benson Norway for GI who recommended outpatient follow-up. At discharge his LFTs were trending down, he did not have encephalopathy or coagulopathy. His BP was better controlled and he was to resume amlodipine 10 mg daily and hydralazine 50 mg tid.  He has not been able to follow-up with gastroenterology due to finanacial reasons. Albert Blake He presents to [facility] ED earlier today with ongoing abdominal pain. Today he states his pain is in his left lower quadrant. Pain is worse with certain movements. He reports that he has had constipation and vomiting of undigested food. Denies recent alcohol or drug use. Reports his last alcohol use was over 1 week ago. He denies fevers. Denies any medications for his pain as "they told me not to take anything." Specifically he denies Tylenol use.    Subjective:      Albert Blake today has, No headache, No chest pain, No abdominal pain - No Nausea, No new weakness tingling or numbness, No Cough - SOB.    Assessment  & Plan :    Active Problems:   Hep B w/o coma   Essential hypertension   Polysubstance (excluding opioids) dependence (HCC)   Abdominal pain  Elevated LFTs  -This is most likely multifactorial, secondary to acute hepatitis B infection, and polysubstance abuse including cocaine use, as well COVID-19 infection known for causing transaminitis . -Have discussed with GI, plan is to continue with close observation with close LFT monitoring. -Patient with thickened gall bladder wall, questionable calculus cholecystitis on CT abdomen, bladder, will with no leukocytosis, no nausea or vomiting, no right upper quadrant pain, so cholecystitis is unlikely, follow on ultrasound.  HTN -  Continue amolodipine 10 mg/d and hydralazine 50 mg tid  Polysubstance abuse, cocaine/THC -He was counseled  - referral made to [facility] Albert Blake team.   COVID-19 infection -So far this appears to be asymptomatic, elevated LFTs can be attributed to COVID-19 infection as well, monitor clinically, so far no indication for treatment.   COVID-19 Labs  Recent Labs    08/05/19 1800  FERRITIN 844*  CRP 0.9    Lab Results  Component Value Date   SARSCOV2NAA POSITIVE (A) 08/05/2019   SARSCOV2NAA NEGATIVE 07/10/2019     Code Status : Full  Family Communication  : Patient is coherent, awake alert discussed with patient  Disposition Plan  : Home  Barriers For Discharge : Remains with significantly elevated LFTs thousands range, high risk for developing acute decompensated liver failure, will need close monitoring they are trending down before he stable for discharge  Consults  :  GI  Procedures  : None  DVT Prophylaxis  :  [place] lovenox  Lab Results  Component Value Date   PLT 301 08/07/2019    Antibiotics  :    Anti-infectives (From admission, onward)    None        Objective:   Vitals:   08/07/19 0447 08/07/19 0946 08/07/19 1311 08/07/19 1555  BP: (!) 165/93 (!) 158/109 (!) 138/97 (!) 160/102  Pulse: 82  61 70  Resp: 18  18   Temp: 98.2 F (36.8 C)  98.4 F (36.9 C) 99.1 F (37.3 C)  TempSrc: Oral  Oral Oral  SpO2: 100%  100% 100%  Weight: 88.6 kg     Height:        Wt Readings from Last 3 Encounters:  08/07/19 88.6 kg  07/28/19 89.4 kg  07/11/19 78.7 kg     Intake/Output Summary (Last 24 hours) at 08/07/2019 1637 Last data filed at 08/07/2019 0519 Gross per 24 hour  Intake 990 ml  Output --  Net 990 ml     Physical Exam  Awake Alert, Oriented X 3, No new F.N deficits, Normal affect Symmetrical Chest wall movement, Good air movement bilaterally, CTAB RRR,No Gallops,Rubs or new Murmurs, No Parasternal Heave +ve B.Sounds, Abd Soft, No tenderness, No rebound - guarding or rigidity. No Cyanosis, Clubbing or edema, No new Rash or bruise       Data Review:    CBC Recent Labs  Lab 08/04/19 2240 08/07/19 0250  WBC 6.2 6.3  HGB 13.9 13.4  HCT 41.9 40.0  PLT 390 301  MCV 88.4 86.6  MCH 29.3 29.0  MCHC 33.2 33.5  RDW 16.6* 16.5*    Chemistries  Recent Labs  Lab 08/04/19 2240 08/06/19 0238 08/07/19 0250  NA 141 137 139  K 4.2 3.6 3.6  CL 102 103 104  CO2 29 21* 25  GLUCOSE 94 87 92  BUN 12 10 11   CREATININE 1.51* 1.32* 1.09  CALCIUM 9.5 9.1 9.2  AST 2,463* 1,718* 1,191*  ALT 2,351* 1,920* 1,539*  ALKPHOS 291* 274* 270*  BILITOT 4.9* 4.8* 4.3*   ------------------------------------------------------------------------------------------------------------------  No results for input(s): CHOL, HDL, LDLCALC, TRIG, CHOLHDL, LDLDIRECT in the last 72 hours.  No results found for: HGBA1C ------------------------------------------------------------------------------------------------------------------ No results for input(s): TSH, T4TOTAL, T3FREE, THYROIDAB in the last 72 hours.  Invalid input(s):  FREET3 ------------------------------------------------------------------------------------------------------------------ Recent Labs    08/05/19 1800  FERRITIN 844*    Coagulation profile Recent Labs  Lab 08/05/19 0817  INR 1.3*    No results for input(s): DDIMER in the last 72 hours.  Cardiac Enzymes No results for input(s): CKMB, TROPONINI, MYOGLOBIN in the last 168 hours.  Invalid input(s): CK ------------------------------------------------------------------------------------------------------------------ No results found for: BNP  Inpatient Medications  Scheduled Meds: . amLODipine  10 mg Oral Daily  . enoxaparin (LOVENOX) injection  40 mg Subcutaneous Q24H  . folic acid  1 mg Oral Daily  . hydrALAZINE  50 mg Oral Q8H  . multivitamin with minerals  1 tablet Oral Daily  . senna  1 tablet Oral BID  . sodium chloride flush  3 mL Intravenous Once  . thiamine  100 mg Oral Daily   Continuous Infusions: . dextrose 5 % and 0.45% NaCl 75 mL/hr at 08/07/19 0459   PRN Meds:.ibuprofen, ondansetron  Micro Results Recent Results (from the past 240 hour(s))  SARS CORONAVIRUS 2 (TAT 6-24 HRS) Nasopharyngeal Nasopharyngeal Swab     Status: Abnormal   Collection Time: 08/05/19  7:57 AM   Specimen: Nasopharyngeal Swab  Result Value Ref Range Status   SARS Coronavirus 2 POSITIVE (A) NEGATIVE Final    Comment: RESULT CALLED TO, READ BACK BY AND VERIFIED WITH: Leta Baptist RN 14:50 08/05/19 ([email or web address]) (NOTE) SARS-CoV-2 target nucleic acids are DETECTED. The SARS-CoV-2 RNA is generally detectable in upper and lower respiratory specimens during the acute phase of infection. Positive results are indicative of the presence of SARS-CoV-2 RNA. Clinical correlation with patient history and other diagnostic information is  necessary to determine patient infection status. Positive results do not rule out bacterial infection or co-infection with other viruses.  The expected result is  Negative. Fact Sheet for Patients: HairSlick.no Fact Sheet for Healthcare Providers: quierodirigir.com This test is not yet approved or cleared by the [place] FDA and  has been authorized for detection and/or diagnosis of SARS-CoV-2 by FDA under an Emergency Use Authorization (EUA). This EUA will remain  in effect (meaning this test can be used) for t he duration of the COVID-19 declaration under Section 564(b)(1) of the Act, 21 U.S.C. section 360bbb-3(b)(1), unless the authorization is terminated or revoked sooner. Performed at [facility], [address], [place], [place] [address]     Radiology Reports CT ABDOMEN PELVIS W CONTRAST  Result Date: 08/05/2019 CLINICAL DATA:  Acute abdominal pain and vomiting. EXAM: CT ABDOMEN AND PELVIS WITH CONTRAST TECHNIQUE: Multidetector CT imaging of the abdomen and pelvis was performed using the standard protocol following bolus administration of intravenous contrast. CONTRAST:  63mL OMNIPAQUE IOHEXOL 300 MG/ML  SOLN COMPARISON:  Abdominal ultrasound 07/10/2019 FINDINGS: Lower chest: The lung bases are clear of acute process. No pleural effusion or pulmonary lesions. The heart is normal in size. No pericardial effusion. The distal esophagus and aorta are unremarkable. Hepatobiliary: No focal hepatic lesions or intrahepatic biliary dilatation. Decreased attenuation and slightly heterogeneous appearance of the liver could be due to fatty infiltration or hepatic disease such as hepatitis. The gallbladder is not distended but there is mucosal enhancement and pericholecystic fluid or diffuse edema in the gallbladder wall. No obvious gallstones by CT. No gallstones seen on recent ultrasound either.  Normal caliber and course of the common bile duct. Pancreas: No mass, inflammation or ductal dilatation. Spleen: Normal size. No focal lesions. Adrenals/Urinary Tract: The adrenal glands and kidneys are  unremarkable. The bladder is normal. Stomach/Bowel: The stomach, duodenum, small bowel and colon are grossly normal without oral contrast. No inflammatory changes, mass lesions or obstructive findings. The appendix is normal. Vascular/Lymphatic: The aorta is normal in caliber. No dissection. The branch vessels are patent. The major venous structures are patent. No mesenteric or retroperitoneal mass or adenopathy. Small scattered lymph nodes are noted. Reproductive: The prostate gland and seminal vesicles are unremarkable. Other: No pelvic mass or adenopathy. No free pelvic fluid collections. No inguinal mass or adenopathy. No abdominal wall hernia or subcutaneous lesions. Musculoskeletal: No significant bony findings. Small symmetric largely sclerotic bone lesions in both iliac bones, likely small bone infarcts. IMPRESSION: 1. Thickened gallbladder wall and or pericholecystic fluid could be due to acalculus cholecystitis or underlying parenchymal liver disease and low albumin. 2. Decreased attenuation and slight heterogeneous appearance of the liver may suggest underlying liver disease/hepatitis. 3. No other significant acute abdominal/pelvic findings, mass lesions or adenopathy. Electronically Signed   By: Rudie Meyer M.D.   On: 08/05/2019 05:46   US Abdomen Limited RUQ  Result Date: 08/05/2019 CLINICAL DATA:  Abdominal pain EXAM: ULTRASOUND ABDOMEN LIMITED RIGHT UPPER QUADRANT COMPARISON:  CT from yesterday FINDINGS: Gallbladder: Circumferential thickening without striation or pericholecystic edema. No gallstone or focal tenderness Common bile duct: Diameter: 2 mm.  Where visualized, no filling defect. Liver: No focal lesion identified. Within normal limits in parenchymal echogenicity. Portal vein is patent on color Doppler imaging with normal direction of blood flow towards the liver. IMPRESSION: Gallbladder wall thickening without stone or focal tenderness, considered reactive to the patient's hepatitis.  Electronically Signed   By: Marnee Spring M.D.   On: 08/05/2019 08:54   US Abdomen Limited RUQ  Result Date: 07/10/2019 CLINICAL DATA:  Elevated LFTs.  Acute hepatitis. EXAM: ULTRASOUND ABDOMEN LIMITED RIGHT UPPER QUADRANT COMPARISON:  None. FINDINGS: Gallbladder: The gallbladder is suboptimally evaluated as the patient is not NPO. Mild apparent gallbladder wall thickening, potentially accentuated due to underdistention due to non NPO status. No echogenic gallstones or biliary sludge. No definitive pericholecystic fluid. Negative sonographic Murphy sign. Common bile duct: Diameter: Normal in size measuring 3 mm in diameter Liver: Questioned increased echogenicity about portal triad (starry sky appearance). Otherwise, normal echogenicity of the hepatic parenchyma. Question minimal amount of intrahepatic biliary ductal dilatation (representative image 39. No discrete hepatic lesions. Portal vein is patent on color Doppler imaging with normal direction of blood flow towards the liver. No ascites. Other: Incidentally noted diffuse increased echogenicity of the incidentally imaged right kidney without evidence of urinary obstruction (representative image 32). IMPRESSION: 1. Questioned "starry sky" appearance of the hepatic parenchyma as could be seen in the setting of an acute hepatitis. No discrete hepatic lesions. No ascites. 2. Suspected minimal intrahepatic biliary duct dilatation. 3. Apparent gallbladder wall thickening, potentially accentuated due to patient's non NPO status. No echogenic stones or biliary sludge. Negative sonographic Murphy's sign. 4. Increased echogenicity of the right kidney without evidence of urinary obstruction, nonspecific though could be seen in the setting medical renal disease. Electronically Signed   By: Simonne Come M.D.   On: 07/10/2019 07:55      Huey Bienenstock M.D on 08/07/2019 at 4:37 PM  Between 7am to 7pm - Pager - [phone]  After 7pm go to [email or web address] -  password [facility]  [facility] -  Office  [phone]

## 2019-08-07 NOTE — Plan of Care (Signed)

## 2019-08-07 NOTE — TOC Progression Note (Addendum)
Transition of Care [facility]) - Progression Note    Patient Details  Name: Albert Blake MRN: [id number] Date of Birth: 10/16/1985  [facility] of [facility]) CM/SW Contact  Mearl Latin, LCSW Phone Number: 08/07/2019, 4:21 PM  Clinical Narrative:    On day of discharge, CSW will need to have pt sign covid hotel forms and fax them to [phone]. RN will assist patient to fill them out in the highlighted spots. On the last page (Consent Form/waiver), at the bottom under witness, have the nurse sign and date. Medications were sent to [facility] pharm 3/12, w/MATCH letter done- meds will be locked up in main pharmacy until DC. RN will need to retrieve tmeds before DC. CSW to continue to follow.      Expected Discharge Plan: Home/Self Care Barriers to Discharge: Continued Medical Work up  Expected Discharge Plan and Services Expected Discharge Plan: Home/Self Care                                               Social Determinants of Health (SDOH) Interventions    Readmission Risk Interventions No flowsheet data found.

## 2019-08-08 LAB — COMPREHENSIVE METABOLIC PANEL
ALT: 1252 U/L — ABNORMAL HIGH (ref 0–44)
AST: 740 U/L — ABNORMAL HIGH (ref 15–41)
Albumin: 3.1 g/dL — ABNORMAL LOW (ref 3.5–5.0)
Alkaline Phosphatase: 282 U/L — ABNORMAL HIGH (ref 38–126)
Anion gap: 9 (ref 5–15)
BUN: 11 mg/dL (ref 6–20)
CO2: 22 mmol/L (ref 22–32)
Calcium: 9.1 mg/dL (ref 8.9–10.3)
Chloride: 105 mmol/L (ref 98–111)
Creatinine, Ser: 1.09 mg/dL (ref 0.61–1.24)
GFR calc Af Amer: >60 mL/min (ref >60)
GFR calc non Af Amer: >60 mL/min (ref >60)
Glucose, Bld: 93 mg/dL (ref 70–99)
Potassium: 3.6 mmol/L (ref 3.5–5.1)
Sodium: 136 mmol/L (ref 135–145)
Total Bilirubin: 3.2 mg/dL — ABNORMAL HIGH (ref 0.3–1.2)
Total Protein: 7.5 g/dL (ref 6.5–8.1)

## 2019-08-08 MED ORDER — CALCIUM CARBONATE ANTACID 500 MG PO CHEW
1.0000 | CHEWABLE_TABLET | Freq: Once | ORAL | Status: AC
  Administered 2019-08-08: 200 mg via ORAL
  Filled 2019-08-08: qty 1

## 2019-08-08 MED ORDER — ENALAPRILAT 1.25 MG/ML IV SOLN
0.6250 mg | Freq: Once | INTRAVENOUS | Status: DC
  Filled 2019-08-08: qty 0.5

## 2019-08-08 MED ORDER — HYDRALAZINE HCL 50 MG PO TABS
50.0000 mg | ORAL_TABLET | Freq: Four times a day (QID) | ORAL | Status: DC
  Administered 2019-08-08 – 2019-08-09 (×5): 50 mg via ORAL
  Filled 2019-08-08 (×5): qty 1

## 2019-08-08 MED ORDER — POTASSIUM CHLORIDE CRYS ER 20 MEQ PO TBCR
40.0000 meq | EXTENDED_RELEASE_TABLET | Freq: Once | ORAL | Status: AC
  Administered 2019-08-08: 40 meq via ORAL
  Filled 2019-08-08: qty 2

## 2019-08-08 NOTE — Progress Notes (Signed)
Md notified of pt's bp of  161/108 and his hypertension throughout the day and the pt's concern that current bp med is not working for him  md ordered IV vasotec  Pt refused anything iv for bp  Tried to educate pt still refuses  md notified   pt also verbalized that he is having indigestion md made aware  Tums ordered

## 2019-08-08 NOTE — Progress Notes (Signed)
PROGRESS NOTE                                                                                                                                                                                                             Patient Demographics:    Albert Blake, is a 34 y.o. male, DOB - Sep 28, 1985, OZH:[id number]  Admit date - 08/04/2019   Admitting Physician Starleen Armsawood S Mareena Cavan, MD  Outpatient Primary MD for the patient is Patient, No Pcp Per  LOS - 2   Chief Complaint  Patient presents with  . Abdominal Pain       Brief Narrative    Albert Blake is a 34 y.o. male with medical history significant of recent history of hepatitis B presents with abdominal pain. Mr. Albert Blake was admitted to [facility] 05/08/19 for heroine detox and hypertension. He had an uneventful course and was discharged with potential for residential treatment.   Patient was diagnosed with hepatitis B on July 10, 2019. History of IV drug abuse and alcohol abuse. LFT's were >2,000 and CK was > 1000. He was hypertensive. His course was uneventful. He was seen in consultation by Dr. Elnoria HowardHung for GI who recommended outpatient follow-up. At discharge his LFTs were trending down, he did not have encephalopathy or coagulopathy. His BP was better controlled and he was to resume amlodipine 10 mg daily and hydralazine 50 mg tid.  He has not been able to follow-up with gastroenterology due to finanacial reasons. [name]. He presents to [facility] ED earlier today with ongoing abdominal pain. Today he states his pain is in his left lower quadrant. Pain is worse with certain movements. He reports that he has had constipation and vomiting of undigested food. Denies recent alcohol or drug use. Reports his last alcohol use was over 1 week ago. He denies fevers. Denies any medications for his pain as "they told me not to take anything." Specifically he denies Tylenol use.    Subjective:      Albert Blake today has, No headache, No chest pain, No abdominal pain - No Nausea, No new weakness tingling or numbness, No Cough - SOB.    Assessment  & Plan :    Active Problems:   Hep B w/o coma   Essential hypertension   Polysubstance (excluding opioids) dependence (HCC)   Abdominal pain  Elevated LFTs  -This is most likely multifactorial, secondary to acute hepatitis B infection, and polysubstance abuse including cocaine use, as well COVID-19 infection known for causing transaminitis . -Have discussed with GI, plan is to continue with close observation with close LFT monitoring. -Patient with thickened gall bladder wall, questionable calculus cholecystitis on CT abdomen, bladder, will with no leukocytosis, no nausea or vomiting, no right upper quadrant pain, so cholecystitis is unlikely, follow on ultrasound. - ALT>AST is typical for viral hepatitis liver disease  HTN -  Blood pressure remains elevated, continue with amlodipine 10 mg oral daily, will increase hydralazine to 50 mg 4 times daily  Polysubstance abuse, cocaine/THC -He was counseled  - referral made to [facility] team.   COVID-19 infection -So far this appears to be asymptomatic, elevated LFTs can be attributed to COVID-19 infection as well, monitor clinically, so far no indication for treatment.   COVID-19 Labs  Recent Labs    08/05/19 1800  FERRITIN 844*  CRP 0.9    Lab Results  Component Value Date   SARSCOV2NAA POSITIVE (A) 08/05/2019   SARSCOV2NAA NEGATIVE 07/10/2019     Code Status : Full  Family Communication  : Patient is coherent, awake alert discussed with patient  Disposition Plan  : Social worker consulted, patient is homeless, he will be discharged to [employer] hotel  Barriers For Discharge : Remains with significantly elevated LFTs thousands range, high risk for developing acute decompensated liver failure, will need close monitoring they are trending down significantly before he  stable for discharge  Consults  :  GI  Procedures  : None  DVT Prophylaxis  :  [place] lovenox  Lab Results  Component Value Date   PLT 301 08/07/2019    Antibiotics  :    Anti-infectives (From admission, onward)   None        Objective:   Vitals:   08/07/19 2122 08/08/19 0419 08/08/19 0552 08/08/19 0952  BP: (!) 175/106 (!) 157/105 (!) 158/108 (!) 159/110  Pulse:  60    Resp:  18    Temp:  98.6 F (37 C)    TempSrc:  Oral    SpO2:  100%    Weight:      Height:        Wt Readings from Last 3 Encounters:  08/07/19 88.6 kg  07/28/19 89.4 kg  07/11/19 78.7 kg     Intake/Output Summary (Last 24 hours) at 08/08/2019 1206 Last data filed at 08/08/2019 0900 Gross per 24 hour  Intake 2726.3 ml  Output 1 ml  Net 2725.3 ml     Physical Exam  Awake Alert, Oriented X 3, No new F.N deficits, Normal affect Symmetrical Chest wall movement, Good air movement bilaterally, CTAB RRR,No Gallops,Rubs or new Murmurs, No Parasternal Heave +ve B.Sounds, Abd Soft, No tenderness, No rebound - guarding or rigidity. No Cyanosis, Clubbing or edema, No new Rash or bruise        Data Review:    CBC Recent Labs  Lab 08/04/19 2240 08/07/19 0250  WBC 6.2 6.3  HGB 13.9 13.4  HCT 41.9 40.0  PLT 390 301  MCV 88.4 86.6  MCH 29.3 29.0  MCHC 33.2 33.5  RDW 16.6* 16.5*    Chemistries  Recent Labs  Lab 08/04/19 2240 08/06/19 0238 08/07/19 0250 08/08/19 0301  NA 141 137 139 136  K 4.2 3.6 3.6 3.6  CL 102 103 104 105  CO2 29 21* 25 22  GLUCOSE 94 87 92 93  BUN 12 10 11 11   CREATININE 1.51* 1.32* 1.09 1.09  CALCIUM 9.5 9.1 9.2 9.1  AST 2,463* 1,718* 1,191* 740*  ALT 2,351* 1,920* 1,539* 1,252*  ALKPHOS 291* 274* 270* 282*  BILITOT 4.9* 4.8* 4.3* 3.2*   ------------------------------------------------------------------------------------------------------------------ No results for input(s): CHOL, HDL, LDLCALC, TRIG, CHOLHDL, LDLDIRECT in the last 72 hours.  No  results found for: HGBA1C ------------------------------------------------------------------------------------------------------------------ No results for input(s): TSH, T4TOTAL, T3FREE, THYROIDAB in the last 72 hours.  Invalid input(s): FREET3 ------------------------------------------------------------------------------------------------------------------ Recent Labs    08/05/19 1800  FERRITIN 844*    Coagulation profile Recent Labs  Lab 08/05/19 0817  INR 1.3*    No results for input(s): DDIMER in the last 72 hours.  Cardiac Enzymes No results for input(s): CKMB, TROPONINI, MYOGLOBIN in the last 168 hours.  Invalid input(s): CK ------------------------------------------------------------------------------------------------------------------ No results found for: BNP  Inpatient Medications  Scheduled Meds: . amLODipine  10 mg Oral Daily  . enoxaparin (LOVENOX) injection  40 mg Subcutaneous Q24H  . folic acid  1 mg Oral Daily  . hydrALAZINE  50 mg Oral Q6H  . multivitamin with minerals  1 tablet Oral Daily  . senna  1 tablet Oral BID  . sodium chloride flush  3 mL Intravenous Once  . thiamine  100 mg Oral Daily   Continuous Infusions:  PRN Meds:.ibuprofen, ondansetron  Micro Results Recent Results (from the past 240 hour(s))  SARS CORONAVIRUS 2 (TAT 6-24 HRS) Nasopharyngeal Nasopharyngeal Swab     Status: Abnormal   Collection Time: 08/05/19  7:57 AM   Specimen: Nasopharyngeal Swab  Result Value Ref Range Status   SARS Coronavirus 2 POSITIVE (A) NEGATIVE Final    Comment: RESULT CALLED TO, READ BACK BY AND VERIFIED WITH: 10/05/19 RN 14:50 08/05/19 ([email or web address]) (NOTE) SARS-CoV-2 target nucleic acids are DETECTED. The SARS-CoV-2 RNA is generally detectable in upper and lower respiratory specimens during the acute phase of infection. Positive results are indicative of the presence of SARS-CoV-2 RNA. Clinical correlation with patient history and other diagnostic  information is  necessary to determine patient infection status. Positive results do not rule out bacterial infection or co-infection with other viruses.  The expected result is Negative. Fact Sheet for Patients: 10/05/19 Fact Sheet for Healthcare Providers: HairSlick.no This test is not yet approved or cleared by the quierodirigir.com FDA and  has been authorized for detection and/or diagnosis of SARS-CoV-2 by FDA under an Emergency Use Authorization (EUA). This EUA will remain  in effect (meaning this test can be used) for t he duration of the COVID-19 declaration under Section 564(b)(1) of the Act, 21 U.S.C. section 360bbb-3(b)(1), unless the authorization is terminated or revoked sooner. Performed at [facility], [address], [place], [place]     Radiology Reports CT ABDOMEN PELVIS W CONTRAST  Result Date: 08/05/2019 CLINICAL DATA:  Acute abdominal pain and vomiting. EXAM: CT ABDOMEN AND PELVIS WITH CONTRAST TECHNIQUE: Multidetector CT imaging of the abdomen and pelvis was performed using the standard protocol following bolus administration of intravenous contrast. CONTRAST:  72mL OMNIPAQUE IOHEXOL 300 MG/ML  SOLN COMPARISON:  Abdominal ultrasound 07/10/2019 FINDINGS: Lower chest: The lung bases are clear of acute process. No pleural effusion or pulmonary lesions. The heart is normal in size. No pericardial effusion. The distal esophagus and aorta are unremarkable. Hepatobiliary: No focal hepatic lesions or intrahepatic biliary dilatation. Decreased attenuation and slightly heterogeneous appearance of the liver could be due to fatty infiltration or hepatic disease such as hepatitis. The  gallbladder is not distended but there is mucosal enhancement and pericholecystic fluid or diffuse edema in the gallbladder wall. No obvious gallstones by CT. No gallstones seen on recent ultrasound either. Normal caliber and  course of the common bile duct. Pancreas: No mass, inflammation or ductal dilatation. Spleen: Normal size. No focal lesions. Adrenals/Urinary Tract: The adrenal glands and kidneys are unremarkable. The bladder is normal. Stomach/Bowel: The stomach, duodenum, small bowel and colon are grossly normal without oral contrast. No inflammatory changes, mass lesions or obstructive findings. The appendix is normal. Vascular/Lymphatic: The aorta is normal in caliber. No dissection. The branch vessels are patent. The major venous structures are patent. No mesenteric or retroperitoneal mass or adenopathy. Small scattered lymph nodes are noted. Reproductive: The prostate gland and seminal vesicles are unremarkable. Other: No pelvic mass or adenopathy. No free pelvic fluid collections. No inguinal mass or adenopathy. No abdominal wall hernia or subcutaneous lesions. Musculoskeletal: No significant bony findings. Small symmetric largely sclerotic bone lesions in both iliac bones, likely small bone infarcts. IMPRESSION: 1. Thickened gallbladder wall and or pericholecystic fluid could be due to acalculus cholecystitis or underlying parenchymal liver disease and low albumin. 2. Decreased attenuation and slight heterogeneous appearance of the liver may suggest underlying liver disease/hepatitis. 3. No other significant acute abdominal/pelvic findings, mass lesions or adenopathy. Electronically Signed   By: Rudie Meyer M.D.   On: 08/05/2019 05:46   US Abdomen Limited RUQ  Result Date: 08/05/2019 CLINICAL DATA:  Abdominal pain EXAM: ULTRASOUND ABDOMEN LIMITED RIGHT UPPER QUADRANT COMPARISON:  CT from yesterday FINDINGS: Gallbladder: Circumferential thickening without striation or pericholecystic edema. No gallstone or focal tenderness Common bile duct: Diameter: 2 mm.  Where visualized, no filling defect. Liver: No focal lesion identified. Within normal limits in parenchymal echogenicity. Portal vein is patent on color Doppler  imaging with normal direction of blood flow towards the liver. IMPRESSION: Gallbladder wall thickening without stone or focal tenderness, considered reactive to the patient's hepatitis. Electronically Signed   By: Marnee Spring M.D.   On: 08/05/2019 08:54   US Abdomen Limited RUQ  Result Date: 07/10/2019 CLINICAL DATA:  Elevated LFTs.  Acute hepatitis. EXAM: ULTRASOUND ABDOMEN LIMITED RIGHT UPPER QUADRANT COMPARISON:  None. FINDINGS: Gallbladder: The gallbladder is suboptimally evaluated as the patient is not NPO. Mild apparent gallbladder wall thickening, potentially accentuated due to underdistention due to non NPO status. No echogenic gallstones or biliary sludge. No definitive pericholecystic fluid. Negative sonographic Murphy sign. Common bile duct: Diameter: Normal in size measuring 3 mm in diameter Liver: Questioned increased echogenicity about portal triad (starry sky appearance). Otherwise, normal echogenicity of the hepatic parenchyma. Question minimal amount of intrahepatic biliary ductal dilatation (representative image 39. No discrete hepatic lesions. Portal vein is patent on color Doppler imaging with normal direction of blood flow towards the liver. No ascites. Other: Incidentally noted diffuse increased echogenicity of the incidentally imaged right kidney without evidence of urinary obstruction (representative image 32). IMPRESSION: 1. Questioned "starry sky" appearance of the hepatic parenchyma as could be seen in the setting of an acute hepatitis. No discrete hepatic lesions. No ascites. 2. Suspected minimal intrahepatic biliary duct dilatation. 3. Apparent gallbladder wall thickening, potentially accentuated due to patient's non NPO status. No echogenic stones or biliary sludge. Negative sonographic Murphy's sign. 4. Increased echogenicity of the right kidney without evidence of urinary obstruction, nonspecific though could be seen in the setting medical renal disease. Electronically Signed    By: Simonne Come M.D.   On: 07/10/2019 07:55  Phillips Climes M.D on 08/08/2019 at 12:06 PM  Between 7am to 7pm - Pager - [phone]  After 7pm go to [email or web address] - password [facility]  [facility] -  Office  [phone]

## 2019-08-09 LAB — COMPREHENSIVE METABOLIC PANEL
ALT: 937 U/L — ABNORMAL HIGH (ref 0–44)
AST: 477 U/L — ABNORMAL HIGH (ref 15–41)
Albumin: 3.3 g/dL — ABNORMAL LOW (ref 3.5–5.0)
Alkaline Phosphatase: 274 U/L — ABNORMAL HIGH (ref 38–126)
Anion gap: 11 (ref 5–15)
BUN: 12 mg/dL (ref 6–20)
CO2: 23 mmol/L (ref 22–32)
Calcium: 9.5 mg/dL (ref 8.9–10.3)
Chloride: 103 mmol/L (ref 98–111)
Creatinine, Ser: 1.05 mg/dL (ref 0.61–1.24)
GFR calc Af Amer: >60 mL/min (ref >60)
GFR calc non Af Amer: >60 mL/min (ref >60)
Glucose, Bld: 91 mg/dL (ref 70–99)
Potassium: 3.9 mmol/L (ref 3.5–5.1)
Sodium: 137 mmol/L (ref 135–145)
Total Bilirubin: 3.4 mg/dL — ABNORMAL HIGH (ref 0.3–1.2)
Total Protein: 7.8 g/dL (ref 6.5–8.1)

## 2019-08-09 MED ORDER — HYDRALAZINE HCL 50 MG PO TABS: 100.0000 mg | ORAL_TABLET | Freq: Three times a day (TID) | ORAL | 0 refills | Status: AC

## 2019-08-09 NOTE — Discharge Instructions (Signed)
Person Under Monitoring Name: Albert Blake  Location: [address] [place] [address]   Infection Prevention Recommendations for Individuals Confirmed to have, or Being Evaluated for, 2019 Novel Coronavirus (COVID-19) Infection Who Receive Care at Home  Individuals who are confirmed to have, or are being evaluated for, COVID-19 should follow the prevention steps below until a healthcare provider or local or state health department says they can return to normal activities.  Stay home except to get medical care You should restrict activities outside your home, except for getting medical care. Do not go to work, school, or public areas, and do not use public transportation or taxis.  Call ahead before visiting your doctor Before your medical appointment, call the healthcare provider and tell them that you have, or are being evaluated for, COVID-19 infection. This will help the healthcare providers office take steps to keep other people from getting infected. Ask your healthcare provider to call the local or state health department.  Monitor your symptoms Seek prompt medical attention if your illness is worsening (e.g., difficulty breathing). Before going to your medical appointment, call the healthcare provider and tell them that you have, or are being evaluated for, COVID-19 infection. Ask your healthcare provider to call the local or state health department.  Wear a facemask You should wear a facemask that covers your nose and mouth when you are in the same room with other people and when you visit a healthcare provider. People who live with or visit you should also wear a facemask while they are in the same room with you.  Separate yourself from other people in your home As much as possible, you should stay in a different room from other people in your home. Also, you should use a separate bathroom, if available.  Avoid sharing household items You should not  share dishes, drinking glasses, cups, eating utensils, towels, bedding, or other items with other people in your home. After using these items, you should wash them thoroughly with soap and water.  Cover your coughs and sneezes Cover your mouth and nose with a tissue when you cough or sneeze, or you can cough or sneeze into your sleeve. Throw used tissues in a lined trash can, and immediately wash your hands with soap and water for at least 20 seconds or use an alcohol-based hand rub.  Wash your [employer] your hands often and thoroughly with soap and water for at least 20 seconds. You can use an alcohol-based hand sanitizer if soap and water are not available and if your hands are not visibly dirty. Avoid touching your eyes, nose, and mouth with unwashed hands.   Prevention Steps for Caregivers and Household Members of Individuals Confirmed to have, or Being Evaluated for, COVID-19 Infection Being Cared for in the Home  If you live with, or provide care at home for, a person confirmed to have, or being evaluated for, COVID-19 infection please follow these guidelines to prevent infection:  Follow healthcare providers instructions Make sure that you understand and can help the patient follow any healthcare provider instructions for all care.  Provide for the patients basic needs You should help the patient with basic needs in the home and provide support for getting groceries, prescriptions, and other personal needs.  Monitor the patients symptoms If they are getting sicker, call his or her medical provider and tell them that the patient has, or is being evaluated for, COVID-19 infection. This will help the healthcare providers office  take steps to keep other people from getting infected. Ask the healthcare provider to call the local or state health department.  Limit the number of people who have contact with the patient  If possible, have only one caregiver for the  patient.  Other household members should stay in another home or place of residence. If this is not possible, they should stay  in another room, or be separated from the patient as much as possible. Use a separate bathroom, if available.  Restrict visitors who do not have an essential need to be in the home.  Keep older adults, very young children, and other sick people away from the patient Keep older adults, very young children, and those who have compromised immune systems or chronic health conditions away from the patient. This includes people with chronic heart, lung, or kidney conditions, diabetes, and cancer.  Ensure good ventilation Make sure that shared spaces in the home have good air flow, such as from an air conditioner or an opened window, weather permitting.  Wash your hands often  Wash your hands often and thoroughly with soap and water for at least 20 seconds. You can use an alcohol based hand sanitizer if soap and water are not available and if your hands are not visibly dirty.  Avoid touching your eyes, nose, and mouth with unwashed hands.  Use disposable paper towels to dry your hands. If not available, use dedicated cloth towels and replace them when they become wet.  Wear a facemask and gloves  Wear a disposable facemask at all times in the room and gloves when you touch or have contact with the patients blood, body fluids, and/or secretions or excretions, such as sweat, saliva, sputum, nasal mucus, vomit, urine, or feces.  Ensure the mask fits over your nose and mouth tightly, and do not touch it during use.  Throw out disposable facemasks and gloves after using them. Do not reuse.  Wash your hands immediately after removing your facemask and gloves.  If your personal clothing becomes contaminated, carefully remove clothing and launder. Wash your hands after handling contaminated clothing.  Place all used disposable facemasks, gloves, and other waste in a lined  container before disposing them with other household waste.  Remove gloves and wash your hands immediately after handling these items.  Do not share dishes, glasses, or other household items with the patient  Avoid sharing household items. You should not share dishes, drinking glasses, cups, eating utensils, towels, bedding, or other items with a patient who is confirmed to have, or being evaluated for, COVID-19 infection.  After the person uses these items, you should wash them thoroughly with soap and water.  Wash laundry thoroughly  Immediately remove and wash clothes or bedding that have blood, body fluids, and/or secretions or excretions, such as sweat, saliva, sputum, nasal mucus, vomit, urine, or feces, on them.  Wear gloves when handling laundry from the patient.  Read and follow directions on labels of laundry or clothing items and detergent. In general, wash and dry with the warmest temperatures recommended on the label.  Clean all areas the individual has used often  Clean all touchable surfaces, such as counters, tabletops, doorknobs, bathroom fixtures, toilets, phones, keyboards, tablets, and bedside tables, every day. Also, clean any surfaces that may have blood, body fluids, and/or secretions or excretions on them.  Wear gloves when cleaning surfaces the patient has come in contact with.  Use a diluted bleach solution (e.g., dilute bleach with 1 part  bleach and 10 parts water) or a household disinfectant with a label that says EPA-registered for coronaviruses. To make a bleach solution at home, add 1 tablespoon of bleach to 1 quart (4 cups) of water. For a larger supply, add  cup of bleach to 1 gallon (16 cups) of water.  Read labels of cleaning products and follow recommendations provided on product labels. Labels contain instructions for safe and effective use of the cleaning product including precautions you should take when applying the product, such as wearing gloves or  eye protection and making sure you have good ventilation during use of the product.  Remove gloves and wash hands immediately after cleaning.  Monitor yourself for signs and symptoms of illness Caregivers and household members are considered close contacts, should monitor their health, and will be asked to limit movement outside of the home to the extent possible. Follow the monitoring steps for close contacts listed on the symptom monitoring form.   ? If you have additional questions, contact your local health department or call the epidemiologist on call at [phone] (available 24/7). ? This guidance is subject to change. For the most up-to-date guidance from [facility], please refer to their website: YouBlogs.pl

## 2019-08-09 NOTE — Progress Notes (Signed)
Pt admits to removing his own IV  and refuses to have it replaced

## 2019-08-09 NOTE — Progress Notes (Signed)
   08/09/19 1307  AVS Discharge Documentation  [facility] Discharge Instructions Including Medications Provided to patient/caregiver;Placed in discharge packet for receiving facility  Name of Person Receiving AVS Discharge Instructions Including Medications Albert Blake  Name of Clinician That Reviewed [facility] Discharge Instructions Including Medications Clarnce Flock, RN

## 2019-08-09 NOTE — Discharge Summary (Addendum)
Albert Blake, is a 34 y.o. male  DOB 02-16-1986  MRN [id number].  Admission date:  08/04/2019  Admitting Physician  [name], MD  Discharge Date:  08/09/2019   Primary MD  Patient, No Pcp Per  Recommendations for primary care physician for things to follow:  -Please check CBC, BMP, LFTs during next visit. -Patient to follow-up with GI as an outpatient. -Continue counseling about polysubstance abuse.  Admission Diagnosis  Hep B w/o coma [B19.10] Elevated LFTs [R79.89] Abdominal pain [R10.9]   Discharge Diagnosis  Hep B w/o coma [B19.10] Elevated LFTs [R79.89] Abdominal pain [R10.9]    Active Problems:   Hep B w/o coma   Essential hypertension   Polysubstance (excluding opioids) dependence (HCC)   Abdominal pain      Past Medical History:  Diagnosis Date  . Acute hepatitis 07/10/2019    History reviewed. No pertinent surgical history.     History of present illness and  Hospital Course:     Kindly see H&P for history of present illness and admission details, please review complete Labs, Consult reports and Test reports for all details in brief  HPI  from the history and physical done on the day of admission 08/04/2019  HPI: Albert Blake is a 34 y.o. male with medical history significant of recent history of hepatitis B presents with abdominal pain. Albert Blake was admitted to [facility] 05/08/19 for heroine detox and hypertension. He had an uneventful course and was discharged with potential for residential treatment.   Patient was diagnosed with hepatitis B on July 10, 2019. History of IV drug abuse and alcohol abuse. LFT's were >2,000 and CK was > 1000. He was hypertensive. His course was uneventful. He was seen in consultation by Dr. Benson Norway for GI who recommended outpatient follow-up. At discharge his LFTs were trending down, he did not have  encephalopathy or coagulopathy. His BP was better controlled and he was to resume amlodipine 10 mg daily and hydralazine 50 mg tid.  He has not been able to follow-up with gastroenterology due to finanacial reasons. [name] He presents to [facility] ED earlier today with ongoing abdominal pain. Today he states his pain is in his left lower quadrant. Pain is worse with certain movements. He reports that he has had constipation and vomiting of undigested food. Denies recent alcohol or drug use. Reports his last alcohol use was over 1 week ago. He denies fevers. Denies any medications for his pain as "they told me not to take anything." Specifically he denies Tylenol use.   Hospital Course   Elevated LFTs  -This is most likely multifactorial, secondary to acute hepatitis B infection, and polysubstance abuse including cocaine use, as well COVID-19 infection known for causing transaminitis . -Have discussed with GI, plan is to continue with close observation with close LFT monitoring.  No active intervention needed, LFTs remains elevated, but has significantly trending down, with alk phos 274, AST 477, and ALT 937 on discharge was encouraged to increase his fluid intake, and  to abstain from his polysubstance abuse as likely worsen his LFTs, as well to follow with GI as an outpatient . -Patient with thickened gall bladder wall, questionable calculus cholecystitis on CT abdomen, bladder, will with no leukocytosis, no nausea or vomiting, no right upper quadrant pain, so cholecystitis is unlikely, follow on ultrasound. - ALT>AST is typical for viral hepatitis liver disease -Patient with no evidence of hepatic encephalopathy, INR is 1.3  HTN - Blood pressure poorly controlled, continue with amlodipine, have increased his hydralazine to 100 mg oral 3 times daily .  Polysubstance abuse, cocaine/THC -He was counseled  - referral made to [facility] team.  COVID-19 infection -So far this appears to be asymptomatic,  but 19 infection can be contributing to his LFT elevation as well, so far no indication for treatment, I have instructed patient to come back to ED if he has any dyspnea.   Discharge Condition:  Stable   Follow UP  Follow-up Information    Placey, Audrea Muscat, NP Follow up.   Why: Audrea Muscat is available at [facility] for establishing primary care as well as medication assistance after discharge.  Contact information: [place] [address] [phone]        [facility]. Go on 08/10/2019.   Why: Appointment at [facility]. Follow the signs for the Respiratory Clinic.  Contact information: [address] by [place] [phone]        Carol Ada, MD Follow up in 10 day(s).   Specialty: Gastroenterology Contact information: [address] [place] [address] [phone]             Discharge Instructions  and  Discharge Medications     Discharge Instructions    Discharge instructions   Complete by: As directed    Follow with Primary MD Patient, in 10 days   Get CBC, CMP, checked  by Primary MD next visit.    Activity: As tolerated with Full fall precautions use walker/cane & assistance as needed   Disposition Home **   Diet: Heart Healthy ** , with feeding assistance and aspiration precautions.  For Heart failure patients - Check your Weight same time everyday, if you gain over 2 pounds, or you develop in leg swelling, experience more shortness of breath or chest pain, call your Primary MD immediately. Follow Cardiac Low Salt Diet and 1.5 lit/day fluid restriction.   On your next visit with your primary care physician please Get Medicines reviewed and adjusted.   Please request your Prim.MD to go over all Hospital Tests and Procedure/Radiological results at the follow up, please get all Hospital records sent to your Prim MD by signing hospital release before you go home.   If you experience worsening  of your admission symptoms, develop shortness of breath, life threatening emergency, suicidal or homicidal thoughts you must seek medical attention immediately by calling 911 or calling your MD immediately  if symptoms less severe.  You Must read complete instructions/literature along with all the possible adverse reactions/side effects for all the Medicines you take and that have been prescribed to you. Take any new Medicines after you have completely understood and accpet all the possible adverse reactions/side effects.   Do not drive, operating heavy machinery, perform activities at heights, swimming or participation in water activities or provide baby sitting services if your were admitted for syncope or siezures until you have seen by Primary MD or a Neurologist and advised to do so again.  Do not drive when taking Pain medications.    Do not take more than prescribed Pain, Sleep and Anxiety Medications  Special Instructions: If you have smoked or chewed Tobacco  in the last 2 yrs please stop smoking, stop any regular Alcohol  and or any Recreational drug use.  Wear Seat belts while driving.   Please note  You were cared for by a hospitalist during your hospital stay. If you have any questions about your discharge medications or the care you received while you were in the hospital after you are discharged, you can call the unit and asked to speak with the hospitalist on call if the hospitalist that took care of you is not available. Once you are discharged, your primary care physician will handle any further medical issues. Please note that NO REFILLS for any discharge medications will be authorized once you are discharged, as it is imperative that you return to your primary care physician (or establish a relationship with a primary care physician if you do not have one) for your aftercare needs so that they can reassess your need for medications and monitor your lab values.   Increase  activity slowly   Complete by: As directed      Allergies as of 08/09/2019   No Known Allergies     Medication List    TAKE these medications   amLODipine 10 MG tablet Commonly known as: NORVASC Take 1 tablet (10 mg total) by mouth daily.   folic acid 1 MG tablet Commonly known as: FOLVITE Take 1 tablet (1 mg total) by mouth daily.   hydrALAZINE 50 MG tablet Commonly known as: APRESOLINE Take 2 tablets (100 mg total) by mouth 3 (three) times daily. What changed:   how much to take  when to take this   thiamine 100 MG tablet Take 1 tablet (100 mg total) by mouth daily.         Diet and Activity recommendation: See Discharge Instructions above   Consults obtained -  GI   Major procedures and Radiology Reports - PLEASE review detailed and final reports for all details, in brief -      CT ABDOMEN PELVIS W CONTRAST  Result Date: 08/05/2019 CLINICAL DATA:  Acute abdominal pain and vomiting. EXAM: CT ABDOMEN AND PELVIS WITH CONTRAST TECHNIQUE: Multidetector CT imaging of the abdomen and pelvis was performed using the standard protocol following bolus administration of intravenous contrast. CONTRAST:  15m OMNIPAQUE IOHEXOL 300 MG/ML  SOLN COMPARISON:  Abdominal ultrasound 07/10/2019 FINDINGS: Lower chest: The lung bases are clear of acute process. No pleural effusion or pulmonary lesions. The heart is normal in size. No pericardial effusion. The distal esophagus and aorta are unremarkable. Hepatobiliary: No focal hepatic lesions or intrahepatic biliary dilatation. Decreased attenuation and slightly heterogeneous appearance of the liver could be due to fatty infiltration or hepatic disease such as hepatitis. The gallbladder is not distended but there is mucosal enhancement and pericholecystic fluid or diffuse edema in the gallbladder wall. No obvious gallstones by CT. No gallstones seen on recent ultrasound either. Normal caliber and course of the common bile duct. Pancreas:  No mass, inflammation or ductal dilatation. Spleen: Normal size. No focal lesions. Adrenals/Urinary Tract: The adrenal glands and kidneys are unremarkable. The bladder is normal. Stomach/Bowel: The stomach, duodenum, small bowel and colon are grossly normal without oral contrast. No inflammatory changes, mass lesions or obstructive findings. The appendix is normal. Vascular/Lymphatic: The aorta is normal in caliber. No dissection. The branch vessels  are patent. The major venous structures are patent. No mesenteric or retroperitoneal mass or adenopathy. Small scattered lymph nodes are noted. Reproductive: The prostate gland and seminal vesicles are unremarkable. Other: No pelvic mass or adenopathy. No free pelvic fluid collections. No inguinal mass or adenopathy. No abdominal wall hernia or subcutaneous lesions. Musculoskeletal: No significant bony findings. Small symmetric largely sclerotic bone lesions in both iliac bones, likely small bone infarcts. IMPRESSION: 1. Thickened gallbladder wall and or pericholecystic fluid could be due to acalculus cholecystitis or underlying parenchymal liver disease and low albumin. 2. Decreased attenuation and slight heterogeneous appearance of the liver may suggest underlying liver disease/hepatitis. 3. No other significant acute abdominal/pelvic findings, mass lesions or adenopathy. Electronically Signed   By: Marijo Sanes M.D.   On: 08/05/2019 05:46   US Abdomen Limited RUQ  Result Date: 08/05/2019 CLINICAL DATA:  Abdominal pain EXAM: ULTRASOUND ABDOMEN LIMITED RIGHT UPPER QUADRANT COMPARISON:  CT from yesterday FINDINGS: Gallbladder: Circumferential thickening without striation or pericholecystic edema. No gallstone or focal tenderness Common bile duct: Diameter: 2 mm.  Where visualized, no filling defect. Liver: No focal lesion identified. Within normal limits in parenchymal echogenicity. Portal vein is patent on color Doppler imaging with normal direction of blood flow  towards the liver. IMPRESSION: Gallbladder wall thickening without stone or focal tenderness, considered reactive to the patient's hepatitis. Electronically Signed   By: Monte Fantasia M.D.   On: 08/05/2019 08:54    Micro Results     Recent Results (from the past 240 hour(s))  SARS CORONAVIRUS 2 (TAT 6-24 HRS) Nasopharyngeal Nasopharyngeal Swab     Status: Abnormal   Collection Time: 08/05/19  7:57 AM   Specimen: Nasopharyngeal Swab  Result Value Ref Range Status   SARS Coronavirus 2 POSITIVE (A) NEGATIVE Final    Comment: RESULT CALLED TO, READ BACK BY AND VERIFIED WITH: Shade Flood RN 14:50 08/05/19 ([email or web address]) (NOTE) SARS-CoV-2 target nucleic acids are DETECTED. The SARS-CoV-2 RNA is generally detectable in upper and lower respiratory specimens during the acute phase of infection. Positive results are indicative of the presence of SARS-CoV-2 RNA. Clinical correlation with patient history and other diagnostic information is  necessary to determine patient infection status. Positive results do not rule out bacterial infection or co-infection with other viruses.  The expected result is Negative. Fact Sheet for Patients: SugarRoll.be Fact Sheet for Healthcare Providers: https://www.woods-mathews.com/ This test is not yet approved or cleared by the [place] FDA and  has been authorized for detection and/or diagnosis of SARS-CoV-2 by FDA under an Emergency Use Authorization (EUA). This EUA will remain  in effect (meaning this test can be used) for t he duration of the COVID-19 declaration under Section 564(b)(1) of the Act, 21 U.S.C. section 360bbb-3(b)(1), unless the authorization is terminated or revoked sooner. Performed at [facility], [place] [address], [place], [place] [address]        Today   Subjective:   Albert Blake today has no headache,no chest abdominal pain,no new weakness tingling or numbness, feels much  better wants to go home today.   Objective:   Blood pressure (!) 150/91, pulse 79, temperature 98.2 F (36.8 C), resp. rate 20, height _0  (1.88 m), weight 88.6 kg, SpO2 100 %.   Intake/Output Summary (Last 24 hours) at 08/09/2019 1136 Last data filed at 08/09/2019 0844 Gross per 24 hour  Intake 1440 ml  Output --  Net 1440 ml    Exam Awake Alert, Oriented x 3, No new F.N  deficits, Normal affect Symmetrical Chest wall movement, Good air movement bilaterally, CTAB RRR,No Gallops,Rubs or new Murmurs, No Parasternal Heave +ve B.Sounds, Abd Soft, Non tender,No rebound -guarding or rigidity. No Cyanosis, Clubbing or edema, No new Rash or bruise  Data Review   CBC w Diff:  Lab Results  Component Value Date   WBC 6.3 08/07/2019   HGB 13.4 08/07/2019   HCT 40.0 08/07/2019   PLT 301 08/07/2019   LYMPHOPCT 37 07/28/2019   MONOPCT 14 07/28/2019   EOSPCT 2 07/28/2019   BASOPCT 1 07/28/2019    CMP:  Lab Results  Component Value Date   NA 137 08/09/2019   K 3.9 08/09/2019   CL 103 08/09/2019   CO2 23 08/09/2019   BUN 12 08/09/2019   CREATININE 1.05 08/09/2019   PROT 7.8 08/09/2019   ALBUMIN 3.3 (L) 08/09/2019   BILITOT 3.4 (H) 08/09/2019   ALKPHOS 274 (H) 08/09/2019   AST 477 (H) 08/09/2019   ALT 937 (H) 08/09/2019  .   Total Time in preparing paper work, data evaluation and todays exam - 38 minutes  Phillips Climes M.D on 08/09/2019 at 11:36 AM  [facility]   Office  [phone]

## 2019-08-09 NOTE — TOC Progression Note (Addendum)
Transition of Care [facility]) - Progression Note    Patient Details  Name: Albert Blake MRN: [id number] Date of Birth: 04-11-1986  Transition of [facility]) CM/SW Contact  Levada Schilling Phone Number: 08/09/2019, 2:45 PM  Clinical Narrative:    Pt left hospital after being informed that he could not leave with his friend to isolate at [facility] will have to wait until state set everything up for isolation at hotel.   Expected Discharge Plan: Home/Self Care Barriers to Discharge: Continued Medical Work up  Expected Discharge Plan and Services Expected Discharge Plan: Home/Self Care         Expected Discharge Date: 08/09/19                                     Social Determinants of Health (SDOH) Interventions    Readmission Risk Interventions No flowsheet data found.

## 2019-08-10 ENCOUNTER — Ambulatory Visit: Payer: Self-pay

## 2019-08-14 ENCOUNTER — Encounter (HOSPITAL_COMMUNITY): Payer: Self-pay | Admitting: *Deleted

## 2019-08-14 ENCOUNTER — Other Ambulatory Visit: Payer: Self-pay

## 2019-08-14 ENCOUNTER — Inpatient Hospital Stay (HOSPITAL_COMMUNITY)
Admission: EM | Admit: 2019-08-14 | Discharge: 2019-08-16 | DRG: 603 | Discharge status: Against Medical Advice | Payer: Self-pay | Attending: Internal Medicine | Admitting: Internal Medicine

## 2019-08-14 DIAGNOSIS — L03115 Cellulitis of right lower limb: Secondary | ICD-10-CM | POA: Diagnosis present

## 2019-08-14 DIAGNOSIS — L03116 Cellulitis of left lower limb: Principal | ICD-10-CM | POA: Diagnosis present

## 2019-08-14 DIAGNOSIS — R6510 Systemic inflammatory response syndrome (SIRS) of non-infectious origin without acute organ dysfunction: Secondary | ICD-10-CM | POA: Diagnosis present

## 2019-08-14 DIAGNOSIS — B169 Acute hepatitis B without delta-agent and without hepatic coma: Secondary | ICD-10-CM | POA: Diagnosis present

## 2019-08-14 DIAGNOSIS — N179 Acute kidney failure, unspecified: Secondary | ICD-10-CM | POA: Diagnosis present

## 2019-08-14 DIAGNOSIS — R945 Abnormal results of liver function studies: Secondary | ICD-10-CM | POA: Diagnosis present

## 2019-08-14 DIAGNOSIS — F1010 Alcohol abuse, uncomplicated: Secondary | ICD-10-CM | POA: Diagnosis present

## 2019-08-14 DIAGNOSIS — L03119 Cellulitis of unspecified part of limb: Secondary | ICD-10-CM

## 2019-08-14 DIAGNOSIS — Z5329 Procedure and treatment not carried out because of patient's decision for other reasons: Secondary | ICD-10-CM | POA: Diagnosis present

## 2019-08-14 DIAGNOSIS — F17210 Nicotine dependence, cigarettes, uncomplicated: Secondary | ICD-10-CM | POA: Diagnosis present

## 2019-08-14 DIAGNOSIS — Z833 Family history of diabetes mellitus: Secondary | ICD-10-CM

## 2019-08-14 DIAGNOSIS — I10 Essential (primary) hypertension: Secondary | ICD-10-CM | POA: Diagnosis present

## 2019-08-14 DIAGNOSIS — Z8616 Personal history of COVID-19: Secondary | ICD-10-CM

## 2019-08-14 LAB — CBC
HCT: 38 % — ABNORMAL LOW (ref 39.0–52.0)
Hemoglobin: 12.7 g/dL — ABNORMAL LOW (ref 13.0–17.0)
MCH: 29.2 pg (ref 26.0–34.0)
MCHC: 33.4 g/dL (ref 30.0–36.0)
MCV: 87.4 fL (ref 80.0–100.0)
Platelets: 307 10*3/uL (ref 150–400)
RBC: 4.35 MIL/uL (ref 4.22–5.81)
RDW: 15.3 % (ref 11.5–15.5)
WBC: 15 10*3/uL — ABNORMAL HIGH (ref 4.0–10.5)
nRBC: 0 % (ref 0.0–0.2)

## 2019-08-14 LAB — BASIC METABOLIC PANEL
Anion gap: 12 (ref 5–15)
BUN: 21 mg/dL — ABNORMAL HIGH (ref 6–20)
CO2: 26 mmol/L (ref 22–32)
Calcium: 9.3 mg/dL (ref 8.9–10.3)
Chloride: 99 mmol/L (ref 98–111)
Creatinine, Ser: 1.38 mg/dL — ABNORMAL HIGH (ref 0.61–1.24)
GFR calc Af Amer: >60 mL/min (ref >60)
GFR calc non Af Amer: >60 mL/min (ref >60)
Glucose, Bld: 125 mg/dL — ABNORMAL HIGH (ref 70–99)
Potassium: 3.5 mmol/L (ref 3.5–5.1)
Sodium: 137 mmol/L (ref 135–145)

## 2019-08-14 NOTE — ED Triage Notes (Signed)
Pt reports that he is having pain in bilateral legs and swelling, he denies SOB at this time. COVID +

## 2019-08-15 ENCOUNTER — Encounter (HOSPITAL_COMMUNITY): Payer: Self-pay | Admitting: Internal Medicine

## 2019-08-15 ENCOUNTER — Encounter (HOSPITAL_COMMUNITY): Payer: Self-pay

## 2019-08-15 DIAGNOSIS — N179 Acute kidney failure, unspecified: Secondary | ICD-10-CM | POA: Diagnosis present

## 2019-08-15 DIAGNOSIS — L03119 Cellulitis of unspecified part of limb: Secondary | ICD-10-CM

## 2019-08-15 DIAGNOSIS — I10 Essential (primary) hypertension: Secondary | ICD-10-CM

## 2019-08-15 DIAGNOSIS — R6510 Systemic inflammatory response syndrome (SIRS) of non-infectious origin without acute organ dysfunction: Secondary | ICD-10-CM | POA: Diagnosis present

## 2019-08-15 LAB — RAPID URINE DRUG SCREEN, HOSP PERFORMED
Amphetamines: POSITIVE — AB
Barbiturates: NOT DETECTED
Benzodiazepines: NOT DETECTED
Cocaine: POSITIVE — AB
Opiates: NOT DETECTED
Tetrahydrocannabinol: POSITIVE — AB

## 2019-08-15 LAB — URINALYSIS, ROUTINE W REFLEX MICROSCOPIC
Bacteria, UA: NONE SEEN
Bilirubin Urine: NEGATIVE
Glucose, UA: NEGATIVE mg/dL
Ketones, ur: NEGATIVE mg/dL
Leukocytes,Ua: NEGATIVE
Nitrite: NEGATIVE
Protein, ur: NEGATIVE mg/dL
Specific Gravity, Urine: 1.012 (ref 1.005–1.030)
pH: 6 (ref 5.0–8.0)

## 2019-08-15 LAB — CBC
HCT: 35 % — ABNORMAL LOW (ref 39.0–52.0)
Hemoglobin: 11.7 g/dL — ABNORMAL LOW (ref 13.0–17.0)
MCH: 29.5 pg (ref 26.0–34.0)
MCHC: 33.4 g/dL (ref 30.0–36.0)
MCV: 88.2 fL (ref 80.0–100.0)
Platelets: 276 10*3/uL (ref 150–400)
RBC: 3.97 MIL/uL — ABNORMAL LOW (ref 4.22–5.81)
RDW: 15.3 % (ref 11.5–15.5)
WBC: 11 10*3/uL — ABNORMAL HIGH (ref 4.0–10.5)
nRBC: 0 % (ref 0.0–0.2)

## 2019-08-15 LAB — LACTIC ACID, PLASMA: Lactic Acid, Venous: 0.6 mmol/L (ref 0.5–1.9)

## 2019-08-15 LAB — COMPREHENSIVE METABOLIC PANEL
ALT: 262 U/L — ABNORMAL HIGH (ref 0–44)
AST: 84 U/L — ABNORMAL HIGH (ref 15–41)
Albumin: 3.8 g/dL (ref 3.5–5.0)
Alkaline Phosphatase: 159 U/L — ABNORMAL HIGH (ref 38–126)
Anion gap: 11 (ref 5–15)
BUN: 19 mg/dL (ref 6–20)
CO2: 26 mmol/L (ref 22–32)
Calcium: 9.5 mg/dL (ref 8.9–10.3)
Chloride: 101 mmol/L (ref 98–111)
Creatinine, Ser: 1.36 mg/dL — ABNORMAL HIGH (ref 0.61–1.24)
GFR calc Af Amer: >60 mL/min (ref >60)
GFR calc non Af Amer: >60 mL/min (ref >60)
Glucose, Bld: 90 mg/dL (ref 70–99)
Potassium: 3.5 mmol/L (ref 3.5–5.1)
Sodium: 138 mmol/L (ref 135–145)
Total Bilirubin: 2.9 mg/dL — ABNORMAL HIGH (ref 0.3–1.2)
Total Protein: 8.5 g/dL — ABNORMAL HIGH (ref 6.5–8.1)

## 2019-08-15 LAB — URIC ACID: Uric Acid, Serum: 5.9 mg/dL (ref 3.7–8.6)

## 2019-08-15 LAB — HEPATIC FUNCTION PANEL
ALT: 273 U/L — ABNORMAL HIGH (ref 0–44)
AST: 84 U/L — ABNORMAL HIGH (ref 15–41)
Albumin: 3.7 g/dL (ref 3.5–5.0)
Alkaline Phosphatase: 174 U/L — ABNORMAL HIGH (ref 38–126)
Bilirubin, Direct: 1.3 mg/dL — ABNORMAL HIGH (ref 0.0–0.2)
Indirect Bilirubin: 1.9 mg/dL — ABNORMAL HIGH (ref 0.3–0.9)
Total Bilirubin: 3.2 mg/dL — ABNORMAL HIGH (ref 0.3–1.2)
Total Protein: 8.6 g/dL — ABNORMAL HIGH (ref 6.5–8.1)

## 2019-08-15 LAB — PROTIME-INR
INR: 1.2 (ref 0.8–1.2)
Prothrombin Time: 15.5 seconds — ABNORMAL HIGH (ref 11.4–15.2)

## 2019-08-15 LAB — D-DIMER, QUANTITATIVE: D-Dimer, Quant: 0.43 ug/mL-FEU (ref 0.00–0.50)

## 2019-08-15 MED ORDER — IBUPROFEN 400 MG PO TABS
600.0000 mg | ORAL_TABLET | Freq: Once | ORAL | Status: AC
  Administered 2019-08-15: 600 mg via ORAL
  Filled 2019-08-15: qty 1

## 2019-08-15 MED ORDER — VANCOMYCIN HCL 1500 MG/300ML IV SOLN
1500.0000 mg | Freq: Once | INTRAVENOUS | Status: DC
  Filled 2019-08-15: qty 300

## 2019-08-15 MED ORDER — SODIUM CHLORIDE 0.9 % IV SOLN: 100.0000 mg | Freq: Once | INTRAVENOUS | Status: DC

## 2019-08-15 MED ORDER — VANCOMYCIN HCL 1250 MG/250ML IV SOLN
1250.0000 mg | Freq: Two times a day (BID) | INTRAVENOUS | Status: DC
  Administered 2019-08-15 – 2019-08-16 (×2): 1250 mg via INTRAVENOUS
  Filled 2019-08-15 (×3): qty 250

## 2019-08-15 MED ORDER — AMLODIPINE BESYLATE 10 MG PO TABS
10.0000 mg | ORAL_TABLET | Freq: Every day | ORAL | Status: DC
  Administered 2019-08-15 – 2019-08-16 (×2): 10 mg via ORAL
  Filled 2019-08-15 (×2): qty 1

## 2019-08-15 MED ORDER — SODIUM CHLORIDE 0.9 % IV BOLUS
500.0000 mL | Freq: Once | INTRAVENOUS | Status: AC
  Administered 2019-08-15: 500 mL via INTRAVENOUS

## 2019-08-15 MED ORDER — ONDANSETRON HCL 4 MG/2ML IJ SOLN: 4.0000 mg | Freq: Four times a day (QID) | INTRAMUSCULAR | Status: DC | PRN

## 2019-08-15 MED ORDER — THIAMINE HCL 100 MG PO TABS
100.0000 mg | ORAL_TABLET | Freq: Every day | ORAL | Status: DC
  Administered 2019-08-15 – 2019-08-16 (×2): 100 mg via ORAL
  Filled 2019-08-15 (×2): qty 1

## 2019-08-15 MED ORDER — PIPERACILLIN-TAZOBACTAM 3.375 G IVPB 30 MIN
3.3750 g | Freq: Once | INTRAVENOUS | Status: AC
  Administered 2019-08-15: 3.375 g via INTRAVENOUS
  Filled 2019-08-15: qty 50

## 2019-08-15 MED ORDER — ENOXAPARIN SODIUM 40 MG/0.4ML ~~LOC~~ SOLN
40.0000 mg | SUBCUTANEOUS | Status: DC
  Administered 2019-08-15 – 2019-08-16 (×2): 40 mg via SUBCUTANEOUS
  Filled 2019-08-15 (×2): qty 0.4

## 2019-08-15 MED ORDER — PIPERACILLIN-TAZOBACTAM 3.375 G IVPB
3.3750 g | Freq: Three times a day (TID) | INTRAVENOUS | Status: DC
  Administered 2019-08-15 – 2019-08-16 (×3): 3.375 g via INTRAVENOUS
  Filled 2019-08-15 (×3): qty 50

## 2019-08-15 MED ORDER — VANCOMYCIN HCL 1500 MG/300ML IV SOLN
1500.0000 mg | INTRAVENOUS | Status: AC
  Administered 2019-08-15: 1500 mg via INTRAVENOUS
  Filled 2019-08-15: qty 300

## 2019-08-15 MED ORDER — HYDRALAZINE HCL 50 MG PO TABS
100.0000 mg | ORAL_TABLET | Freq: Three times a day (TID) | ORAL | Status: DC
  Administered 2019-08-15 – 2019-08-16 (×4): 100 mg via ORAL
  Filled 2019-08-15 (×4): qty 2

## 2019-08-15 MED ORDER — ONDANSETRON HCL 4 MG PO TABS: 4.0000 mg | ORAL_TABLET | Freq: Four times a day (QID) | ORAL | Status: DC | PRN

## 2019-08-15 MED ORDER — SODIUM CHLORIDE 0.9 % IV BOLUS
1000.0000 mL | Freq: Once | INTRAVENOUS | Status: AC
  Administered 2019-08-15: 1000 mL via INTRAVENOUS

## 2019-08-15 MED ORDER — FOLIC ACID 1 MG PO TABS
1.0000 mg | ORAL_TABLET | Freq: Every day | ORAL | Status: DC
  Administered 2019-08-15 – 2019-08-16 (×2): 1 mg via ORAL
  Filled 2019-08-15 (×2): qty 1

## 2019-08-15 NOTE — Progress Notes (Signed)
PROGRESS NOTE                                                                                                                                                                                                             Patient Demographics:    Albert Blake, is a 34 y.o. male, DOB - Apr 06, 1986, GYF:[id number]  Outpatient Primary MD for the patient is Patient, No Pcp Per   Admit date - 08/14/2019   LOS - 0  Chief Complaint  Patient presents with  . Leg Swelling       Brief Narrative: Patient is a 34 y.o. male with PMHx of HTN, recently admitted to [facility] for evaluation of hepatitis thought to be secondary to hepatitis B, polysubstance abuse, perhaps Covid-presented with bilateral lower extremity pain and swelling-thought to have lower extremity cellulitis and admitted to the hospitalist service.  Significant Events: 3/9-3/14>>admit to [facility] hepatitis-Covid 19+ but asymptomatic 3/19>> admit to [facility] for cellulitis  Antimicrobial agents: Vancomycin: 3/19>> Zosyn: 3/19>> 3/20  DVT prophylaxis: Lovenox  Microbiology data: 3/20: Blood culture>> pending 3/10: COVID-19 positive  Procedures: None  Consults: None   Subjective:    Albert Blake today was uncooperative during my interview and exam-all he wanted to do was sleep.  He briefly awoke-looked at his legs-and said "swelling is down" and went back to sleep.   Assessment  & Plan :   SIRS secondary to bilateral lower extremity cellulitis: Swelling seems to have improved-some mild erythema persists-still has some mild tenderness on exam.  Continue vancomycin-stop Zosyn.  Await lower extremity Dopplers and blood cultures.  AKI: Appears mild-continue supportive care-suspect this is hemodynamically mediated.  Avoid nephrotoxic agents.  Continue with gentle hydration.  Acute hepatitis: LFTs downtrending compared to his last admission-thought to be  secondary to mostly acute hepatitis B.  Avoid hepatotoxic agents.  Polysubstance abuse/alcohol abuse: Per H&P-apparently has not used drugs since his discharge from his most recent hospitalization.  Prior drug screen positive for cocaine and THC.  No signs of alcohol abuse-follow for now.  Will counsel when more cooperative.  COVID-19 infection: Appears asymptomatic-no need for treatment.  HTN: BP controlled-continue amlodipine  ABG: No results found for: PHART, PCO2ART, PO2ART, HCO3, TCO2, ACIDBASEDEF, O2SAT  Vent Settings: N/A  Condition - Stable  Family Communication  : Patient will update family member himself.  Code Status :  Full Code  Diet :  Diet Order            Diet Heart Room service appropriate? Yes; Fluid consistency: Thin  Diet effective now               Disposition Plan  :  Remain hospitalized  Barriers to discharge: Cellulitis requiring IV antibiotics   Antimicorbials  :    Anti-infectives (From admission, onward)   Start     Dose/Rate Route Frequency Ordered Stop   08/15/19 1600  vancomycin (VANCOREADY) IVPB 1500 mg/300 mL     1,500 mg 150 mL/hr over 120 Minutes Intravenous  Once 08/15/19 0624     08/15/19 1000  piperacillin-tazobactam (ZOSYN) IVPB 3.375 g     3.375 g 12.5 mL/hr over 240 Minutes Intravenous Every 8 hours 08/15/19 0624     08/15/19 0400  piperacillin-tazobactam (ZOSYN) IVPB 3.375 g     3.375 g 100 mL/hr over 30 Minutes Intravenous  Once 08/15/19 0352 08/15/19 0431   08/15/19 0400  vancomycin (VANCOREADY) IVPB 1500 mg/300 mL     1,500 mg 150 mL/hr over 120 Minutes Intravenous STAT 08/15/19 0355 08/15/19 0616   08/15/19 0330  doxycycline (VIBRAMYCIN) 100 mg in sodium chloride 0.9 % 250 mL IVPB  Status:  Discontinued     100 mg 125 mL/hr over 120 Minutes Intravenous  Once 08/15/19 0318 08/15/19 0353   08/15/19 0315  doxycycline (VIBRAMYCIN) 100 mg in sodium chloride 0.9 % 250 mL IVPB  Status:  Discontinued     100 mg 125 mL/hr over  120 Minutes Intravenous  Once 08/15/19 0312 08/15/19 0313      Inpatient Medications  Scheduled Meds: . amLODipine  10 mg Oral Daily  . enoxaparin (LOVENOX) injection  40 mg Subcutaneous Q24H  . folic acid  1 mg Oral Daily  . hydrALAZINE  100 mg Oral TID  . thiamine  100 mg Oral Daily   Continuous Infusions: . piperacillin-tazobactam (ZOSYN)  IV 3.375 g (08/15/19 0954)  . vancomycin     PRN Meds:.ondansetron **OR** ondansetron (ZOFRAN) IV   Time Spent in minutes  25 See all Orders from today for further details   Jeoffrey Massed M.D on 08/15/2019 at 9:58 AM  To page go to [email or web address] - use universal password  [facility] -  Office  [phone]    Objective:   Vitals:   08/15/19 0415 08/15/19 0430 08/15/19 0528 08/15/19 0749  BP: 121/74 134/82 133/88 132/86  Pulse: 100 97 83 88  Resp: (!) 24 (!) 21 18 19   Temp:   99.2 F (37.3 C) 97.6 F (36.4 C)  TempSrc:   Oral Oral  SpO2: 98% 98% 100%   Weight:      Height:        Wt Readings from Last 3 Encounters:  08/14/19 87.1 kg  08/07/19 88.6 kg  07/28/19 89.4 kg     Intake/Output Summary (Last 24 hours) at 08/15/2019 08/17/2019 Last data filed at 08/15/2019 0700 Gross per 24 hour  Intake 799.61 ml  Output -  Net 799.61 ml     Physical Exam Gen Exam:Alert awake-but sleepy-asking to be left alone so he can go back to sleep HEENT:atraumatic, normocephalic Chest: B/L clear to auscultation anteriorly CVS:S1S2 regular Abdomen:soft non tender, non distended Extremities: Mild-1+ edema of bilateral lower extremities-some mild erythema.  Tenderness without any crepitus.  Compartments are soft. Neurology: Non focal Skin: no rash   Data Review:    CBC Recent  Labs  Lab 08/14/19 2223 08/15/19 0652  WBC 15.0* 11.0*  HGB 12.7* 11.7*  HCT 38.0* 35.0*  PLT 307 276  MCV 87.4 88.2  MCH 29.2 29.5  MCHC 33.4 33.4  RDW 15.3 15.3    Chemistries  Recent Labs  Lab 08/09/19 0731 08/14/19 2223 08/15/19 0004  08/15/19 0652  NA 137 137  --  138  K 3.9 3.5  --  3.5  CL 103 99  --  101  CO2 23 26  --  26  GLUCOSE 91 125*  --  90  BUN 12 21*  --  19  CREATININE 1.05 1.38*  --  1.36*  CALCIUM 9.5 9.3  --  9.5  AST 477*  --  84* 84*  ALT 937*  --  273* 262*  ALKPHOS 274*  --  174* 159*  BILITOT 3.4*  --  3.2* 2.9*   ------------------------------------------------------------------------------------------------------------------ No results for input(s): CHOL, HDL, LDLCALC, TRIG, CHOLHDL, LDLDIRECT in the last 72 hours.  No results found for: HGBA1C ------------------------------------------------------------------------------------------------------------------ No results for input(s): TSH, T4TOTAL, T3FREE, THYROIDAB in the last 72 hours.  Invalid input(s): FREET3 ------------------------------------------------------------------------------------------------------------------ No results for input(s): VITAMINB12, FOLATE, FERRITIN, TIBC, IRON, RETICCTPCT in the last 72 hours.  Coagulation profile Recent Labs  Lab 08/15/19 0017  INR 1.2    Recent Labs    08/15/19 0017  DDIMER 0.43    Cardiac Enzymes No results for input(s): CKMB, TROPONINI, MYOGLOBIN in the last 168 hours.  Invalid input(s): CK ------------------------------------------------------------------------------------------------------------------ No results found for: BNP  Micro Results No results found for this or any previous visit (from the past 240 hour(s)).  Radiology Reports CT ABDOMEN PELVIS W CONTRAST  Result Date: 08/05/2019 CLINICAL DATA:  Acute abdominal pain and vomiting. EXAM: CT ABDOMEN AND PELVIS WITH CONTRAST TECHNIQUE: Multidetector CT imaging of the abdomen and pelvis was performed using the standard protocol following bolus administration of intravenous contrast. CONTRAST:  42mL OMNIPAQUE IOHEXOL 300 MG/ML  SOLN COMPARISON:  Abdominal ultrasound 07/10/2019 FINDINGS: Lower chest: The lung bases are  clear of acute process. No pleural effusion or pulmonary lesions. The heart is normal in size. No pericardial effusion. The distal esophagus and aorta are unremarkable. Hepatobiliary: No focal hepatic lesions or intrahepatic biliary dilatation. Decreased attenuation and slightly heterogeneous appearance of the liver could be due to fatty infiltration or hepatic disease such as hepatitis. The gallbladder is not distended but there is mucosal enhancement and pericholecystic fluid or diffuse edema in the gallbladder wall. No obvious gallstones by CT. No gallstones seen on recent ultrasound either. Normal caliber and course of the common bile duct. Pancreas: No mass, inflammation or ductal dilatation. Spleen: Normal size. No focal lesions. Adrenals/Urinary Tract: The adrenal glands and kidneys are unremarkable. The bladder is normal. Stomach/Bowel: The stomach, duodenum, small bowel and colon are grossly normal without oral contrast. No inflammatory changes, mass lesions or obstructive findings. The appendix is normal. Vascular/Lymphatic: The aorta is normal in caliber. No dissection. The branch vessels are patent. The major venous structures are patent. No mesenteric or retroperitoneal mass or adenopathy. Small scattered lymph nodes are noted. Reproductive: The prostate gland and seminal vesicles are unremarkable. Other: No pelvic mass or adenopathy. No free pelvic fluid collections. No inguinal mass or adenopathy. No abdominal wall hernia or subcutaneous lesions. Musculoskeletal: No significant bony findings. Small symmetric largely sclerotic bone lesions in both iliac bones, likely small bone infarcts. IMPRESSION: 1. Thickened gallbladder wall and or pericholecystic fluid could be due to acalculus cholecystitis or underlying  parenchymal liver disease and low albumin. 2. Decreased attenuation and slight heterogeneous appearance of the liver may suggest underlying liver disease/hepatitis. 3. No other significant acute  abdominal/pelvic findings, mass lesions or adenopathy. Electronically Signed   By: Marijo Sanes M.D.   On: 08/05/2019 05:46   US Abdomen Limited RUQ  Result Date: 08/05/2019 CLINICAL DATA:  Abdominal pain EXAM: ULTRASOUND ABDOMEN LIMITED RIGHT UPPER QUADRANT COMPARISON:  CT from yesterday FINDINGS: Gallbladder: Circumferential thickening without striation or pericholecystic edema. No gallstone or focal tenderness Common bile duct: Diameter: 2 mm.  Where visualized, no filling defect. Liver: No focal lesion identified. Within normal limits in parenchymal echogenicity. Portal vein is patent on color Doppler imaging with normal direction of blood flow towards the liver. IMPRESSION: Gallbladder wall thickening without stone or focal tenderness, considered reactive to the patient's hepatitis. Electronically Signed   By: Monte Fantasia M.D.   On: 08/05/2019 08:54

## 2019-08-15 NOTE — H&P (Addendum)
History and Physical    Albert Blake GMW:[id number] DOB: [birth year] DOA: 08/14/2019  PCP: Patient, No Pcp Per  Patient coming from: Home.  Chief Complaint: Bilateral lower extremity pain and swelling.  HPI: Albert Blake is a 34 y.o. male with history of hypertension polysubstance abuse recently admitted for abnormal LFTs attributed to hepatitis B alcohol abuse and COVID-19 infection was discharged home 6 days ago on 08/09/2019 at that time patient LFTs were showing a decreasing trend and patient states over the last 2 days has been having increasing swelling and pain of both lower extremities particular on the right lower extremity.  The right lower extremity has swelling extending up to his knee.  Mildly warm to touch and erythematous.  Denies any trauma or insect bite.  Denies chest pain shortness of breath abdominal pain or nausea vomiting.  Has been taking his antihypertensives.  Patient states that since his discharge he has not abused drugs or drink alcohol.  ED Course: In the ER patient had a temperature of 100.6 heart rate of 111 at presentation.  Labs show D-dimer negative lactic acid was normal LFTs were showing AST of 84 ALT of 273 total bilirubin of 3.2 which was showing a decreasing trend from previous.  WBC count was 15 hemoglobin 12.7 creatinine 1.3.  Patient had blood cultures drawn and started on empiric antibiotic for cellulitis.  Fluid bolus was given in the ER.  Review of Systems: As per HPI, rest all negative.   Past Medical History:  Diagnosis Date  . Acute hepatitis 07/10/2019    History reviewed. No pertinent surgical history.   reports that he has been smoking cigarettes. He has been smoking about 1.00 pack per day. He has never used smokeless tobacco. He reports current alcohol use. He reports current drug use. Drugs: Cocaine and Marijuana.  No Known Allergies  Family History  Problem Relation Age of Onset  . Diabetes Mother     Prior to Admission  medications   Medication Sig Start Date End Date Taking? Authorizing Provider  amLODipine (NORVASC) 10 MG tablet Take 1 tablet (10 mg total) by mouth daily. 08/07/19   Elgergawy, Leana Roe, MD  folic acid (FOLVITE) 1 MG tablet Take 1 tablet (1 mg total) by mouth daily. 08/08/19   Elgergawy, Leana Roe, MD  hydrALAZINE (APRESOLINE) 50 MG tablet Take 2 tablets (100 mg total) by mouth 3 (three) times daily. 08/09/19   Elgergawy, Leana Roe, MD  thiamine 100 MG tablet Take 1 tablet (100 mg total) by mouth daily. 08/08/19   Elgergawy, Leana Roe, MD    Physical Exam: Constitutional: Moderately built and nourished. Vitals:   08/15/19 0333 08/15/19 0400 08/15/19 0415 08/15/19 0430  BP:  131/72 121/74 134/82  Pulse:  98 100 97  Resp:   (!) 24 (!) 21  Temp: 99.4 F (37.4 C)     TempSrc: Oral     SpO2:  97% 98% 98%  Weight:      Height:       Eyes: Anicteric no pallor. ENMT: No discharge from the ears eyes nose or mouth. Neck: No mass felt.  No neck rigidity. Respiratory: No rhonchi or crepitations. Cardiovascular: S1-S2 heard. Abdomen: Soft nontender bowel sound present. Musculoskeletal: Bilateral lower extremity edema the one on the right side is extending up to the mid calf.  No restriction in movement. Skin: Mild erythematous both lower extremities.  More on the right side. Neurologic: Alert awake oriented time place and person.  Moves all extremities.  Psychiatric: Appears normal.   Labs on Admission: I have personally reviewed following labs and imaging studies  CBC: Recent Labs  Lab 08/14/19 2223  WBC 15.0*  HGB 12.7*  HCT 38.0*  MCV 87.4  PLT 307   Basic Metabolic Panel: Recent Labs  Lab 08/09/19 0731 08/14/19 2223  NA 137 137  K 3.9 3.5  CL 103 99  CO2 23 26  GLUCOSE 91 125*  BUN 12 21*  CREATININE 1.05 1.38*  CALCIUM 9.5 9.3   GFR: Estimated Creatinine Clearance: 90.1 mL/min (A) (by C-G formula based on SCr of 1.38 mg/dL (H)). Liver Function Tests: Recent Labs  Lab  08/09/19 0731 08/15/19 0004  AST 477* 84*  ALT 937* 273*  ALKPHOS 274* 174*  BILITOT 3.4* 3.2*  PROT 7.8 8.6*  ALBUMIN 3.3* 3.7   No results for input(s): LIPASE, AMYLASE in the last 168 hours. No results for input(s): AMMONIA in the last 168 hours. Coagulation Profile: Recent Labs  Lab 08/15/19 0017  INR 1.2   Cardiac Enzymes: No results for input(s): CKTOTAL, CKMB, CKMBINDEX, TROPONINI in the last 168 hours. BNP (last 3 results) No results for input(s): PROBNP in the last 8760 hours. HbA1C: No results for input(s): HGBA1C in the last 72 hours. CBG: No results for input(s): GLUCAP in the last 168 hours. Lipid Profile: No results for input(s): CHOL, HDL, LDLCALC, TRIG, CHOLHDL, LDLDIRECT in the last 72 hours. Thyroid Function Tests: No results for input(s): TSH, T4TOTAL, FREET4, T3FREE, THYROIDAB in the last 72 hours. Anemia Panel: No results for input(s): VITAMINB12, FOLATE, FERRITIN, TIBC, IRON, RETICCTPCT in the last 72 hours. Urine analysis:    Component Value Date/Time   COLORURINE AMBER (A) 08/04/2019 2238   APPEARANCEUR HAZY (A) 08/04/2019 2238   LABSPEC 1.030 08/04/2019 2238   PHURINE 5.0 08/04/2019 2238   GLUCOSEU NEGATIVE 08/04/2019 2238   HGBUR NEGATIVE 08/04/2019 2238   BILIRUBINUR MODERATE (A) 08/04/2019 2238   KETONESUR NEGATIVE 08/04/2019 2238   PROTEINUR 100 (A) 08/04/2019 2238   NITRITE NEGATIVE 08/04/2019 2238   LEUKOCYTESUR NEGATIVE 08/04/2019 2238   Sepsis Labs: @LABRCNTIP (procalcitonin:4,lacticidven:4) ) Recent Results (from the past 240 hour(s))  SARS CORONAVIRUS 2 (TAT 6-24 HRS) Nasopharyngeal Nasopharyngeal Swab     Status: Abnormal   Collection Time: 08/05/19  7:57 AM   Specimen: Nasopharyngeal Swab  Result Value Ref Range Status   SARS Coronavirus 2 POSITIVE (A) NEGATIVE Final    Comment: RESULT CALLED TO, READ BACK BY AND VERIFIED WITH: 10/05/19 RN 14:50 08/05/19 ([email or web address]) (NOTE) SARS-CoV-2 target nucleic acids are DETECTED. The  SARS-CoV-2 RNA is generally detectable in upper and lower respiratory specimens during the acute phase of infection. Positive results are indicative of the presence of SARS-CoV-2 RNA. Clinical correlation with patient history and other diagnostic information is  necessary to determine patient infection status. Positive results do not rule out bacterial infection or co-infection with other viruses.  The expected result is Negative. Fact Sheet for Patients: 10/05/19 Fact Sheet for Healthcare Providers: HairSlick.no This test is not yet approved or cleared by the quierodirigir.com FDA and  has been authorized for detection and/or diagnosis of SARS-CoV-2 by FDA under an Emergency Use Authorization (EUA). This EUA will remain  in effect (meaning this test can be used) for t he duration of the COVID-19 declaration under Section 564(b)(1) of the Act, 21 U.S.C. section 360bbb-3(b)(1), unless the authorization is terminated or revoked sooner. Performed at [facility], [address] [place], [place]  Radiological Exams on Admission: No results found.   Assessment/Plan Principal Problem:   SIRS (systemic inflammatory response syndrome) (HCC) Active Problems:   Abnormal liver function   Essential hypertension   Cellulitis of lower extremity   ARF (acute renal failure) ([place])    1. SIRS secondary cellulitis of the lower extremities -we will keep patient on empiric antibiotics follow blood cultures.  Will check Dopplers to rule out DVT though unlikely given that patient is D-dimer is negative.  No signs of any compartment syndrome at this time. 2. Hypertension on hydralazine and amlodipine.  Amlodipine may be contributing some of the edema.  But since patient has pain and fever patient likely has cellulitis. 3. Acute renal failure cause not clear.  Fluid bolus has been administered in the ER.  Will check  metabolic panel after administration of the fluid bolus.  UA is pending. 4. History of polysubstance abuse patient states he has not taken any drugs since discharge 6 days ago and has been off alcohol since last admission.  Closely monitor.  UDS is pending. 5. Recent COVID-19 infection positive but patient is asymptomatic except for elevated LFTs.  I think patient's fever is from the cellulitis and patient is presently not hypoxic or having any shortness of breath. 6. Elevated LFTs now showing decreasing trend.  Abdomen appears benign.  Closely monitor.  Elevated LFT was attributed to hepatitis B, COVID-19 infection and polysubstance abuse. 7. Normocytic normochromic anemia appears to be new check anemia panel with next blood draw.  Given the septic-like picture on presentation patient will need close monitoring for any further deterioration and will require inpatient status.   DVT prophylaxis: Lovenox. Code Status: Full code. Family Communication: Discussed with patient. Disposition Plan: Home. Consults called: None. Admission status: Inpatient.   Rise Patience MD [facility] Pager [phone].  If 7PM-7AM, please contact night-coverage [email or web address] Password [facility]  08/15/2019, 4:51 AM

## 2019-08-15 NOTE — ED Provider Notes (Signed)
[facility] EMERGENCY DEPARTMENT Provider Note   CSN: [id number] Arrival date & time: 08/14/19  2131     History Chief Complaint  Patient presents with  . Leg Swelling    Albert Blake is a 34 y.o. male.   34 y.o. male with PMH of Hep B, HTN, polysubstance abuse presents to the emergency department for complaints of bilateral lower extremity swelling.  He states that he began noticing swelling in his lower legs yesterday.  This has been constant and causes discomfort.  Pain is aggravated with ambulation, palpation.  He has not taken any medications for pain, but reports compliance with his hydralazine and Norvasc.  Triage note references associated shortness of breath, but patient states that he is not short of breath and said that just because he was "panicking".  He has no chest pain and denies hemoptysis, syncope or near syncope, dizziness, known fevers.  Was noted to have a low-grade temperature on arrival of 100.6 F.  Tested COVID+ during most recent admission 1.5 weeks ago.  Denies known PMH or FHx of DVT/PE.  Last snorted cocaine 2 days ago; denies IVDU.  Denies ETOH use.       Past Medical History:  Diagnosis Date  . Acute hepatitis 07/10/2019    Patient Active Problem List   Diagnosis Date Noted  . Cellulitis 08/15/2019  . Hep B w/o coma 08/05/2019  . Essential hypertension 08/05/2019  . Polysubstance (excluding opioids) dependence (HCC) 08/05/2019  . Abdominal pain 08/05/2019  . Abnormal liver function 07/10/2019  . Rhabdomyolysis 07/10/2019    History reviewed. No pertinent surgical history.     Family History  Problem Relation Age of Onset  . Diabetes Mother     Social History   Tobacco Use  . Smoking status: Current Every Day Smoker    Packs/day: 1.00    Types: Cigarettes  . Smokeless tobacco: Never Used  Substance Use Topics  . Alcohol use: Yes    Comment: pt states, " I drink about 2 bottles a day."  . Drug use: Yes   Types: Cocaine, Marijuana    Home Medications Prior to Admission medications   Medication Sig Start Date End Date Taking? Authorizing Provider  amLODipine (NORVASC) 10 MG tablet Take 1 tablet (10 mg total) by mouth daily. 08/07/19   Elgergawy, Leana Roe, MD  folic acid (FOLVITE) 1 MG tablet Take 1 tablet (1 mg total) by mouth daily. 08/08/19   Elgergawy, Leana Roe, MD  hydrALAZINE (APRESOLINE) 50 MG tablet Take 2 tablets (100 mg total) by mouth 3 (three) times daily. 08/09/19   Elgergawy, Leana Roe, MD  thiamine 100 MG tablet Take 1 tablet (100 mg total) by mouth daily. 08/08/19   Elgergawy, Leana Roe, MD    Allergies    Patient has no known allergies.  Review of Systems   Review of Systems  Ten systems reviewed and are negative for acute change, except as noted in the HPI.    Physical Exam Updated Vital Signs BP 131/72   Pulse 98   Temp 99.4 F (37.4 C) (Oral)   Resp 17   Ht 6\' 3"  (1.905 m)   Wt 87.1 kg   SpO2 97%   BMI 24.00 kg/m   Physical Exam Vitals and nursing note reviewed.  Constitutional:      General: He is not in acute distress.    Appearance: He is well-developed. He is not diaphoretic.     Comments: Patient in no acute distress.  Nontoxic.  HENT:     Head: Normocephalic and atraumatic.  Eyes:     Conjunctiva/sclera: Conjunctivae normal.     Comments: Mildly icteric sclera  Cardiovascular:     Rate and Rhythm: Normal rate and regular rhythm.     Pulses: Normal pulses.     Comments: Not tachycardic as noted in triage Pulmonary:     Effort: Pulmonary effort is normal. No respiratory distress.     Comments: Respirations even and unlabored Musculoskeletal:     Cervical back: Normal range of motion.     Comments: 1+ pitting edema BLE. Some erythema and heat to touch of the distal BLE. No open sores or weeping. No crepitus.  Skin:    General: Skin is warm and dry.     Coloration: Skin is not pale.     Findings: No erythema or rash.  Neurological:     Mental  Status: He is alert and oriented to person, place, and time.     Coordination: Coordination normal.     Comments: Sensation to light touch intact in bilateral lower extremities.  Moving all extremities spontaneously.  Psychiatric:        Behavior: Behavior normal.     ED Results / Procedures / Treatments   Labs (all labs ordered are listed, but only abnormal results are displayed) Labs Reviewed  CBC - Abnormal; Notable for the following components:      Result Value   WBC 15.0 (*)    Hemoglobin 12.7 (*)    HCT 38.0 (*)    All other components within normal limits  BASIC METABOLIC PANEL - Abnormal; Notable for the following components:   Glucose, Bld 125 (*)    BUN 21 (*)    Creatinine, Ser 1.38 (*)    All other components within normal limits  HEPATIC FUNCTION PANEL - Abnormal; Notable for the following components:   Total Protein 8.6 (*)    AST 84 (*)    ALT 273 (*)    Alkaline Phosphatase 174 (*)    Total Bilirubin 3.2 (*)    Bilirubin, Direct 1.3 (*)    Indirect Bilirubin 1.9 (*)    All other components within normal limits  PROTIME-INR - Abnormal; Notable for the following components:   Prothrombin Time 15.5 (*)    All other components within normal limits  CULTURE, BLOOD (ROUTINE X 2)  CULTURE, BLOOD (ROUTINE X 2)  D-DIMER, QUANTITATIVE (NOT AT [facility])  LACTIC ACID, PLASMA    EKG None  Radiology No results found.  Procedures .Critical Care Performed by: Antony Madura, PA-C Authorized by: Antony Madura, PA-C   Critical care provider statement:    Critical care time (minutes):  45   Critical care was time spent personally by me on the following activities:  Discussions with consultants, evaluation of patient's response to treatment, examination of patient, ordering and performing treatments and interventions, ordering and review of laboratory studies, ordering and review of radiographic studies, pulse oximetry, re-evaluation of patient's condition, obtaining  history from patient or surrogate and review of old charts   (including critical care time)  Medications Ordered in ED Medications  piperacillin-tazobactam (ZOSYN) IVPB 3.375 g (3.375 g Intravenous New Bag/Given 08/15/19 0404)  vancomycin (VANCOREADY) IVPB 1500 mg/300 mL (1,500 mg Intravenous New Bag/Given 08/15/19 0415)  ibuprofen (ADVIL) tablet 600 mg (600 mg Oral Given 08/15/19 0328)  sodium chloride 0.9 % bolus 1,000 mL (0 mLs Intravenous Stopped 08/15/19 0416)    ED Course  I have reviewed  the triage vital signs and the nursing notes.  Pertinent labs & imaging results that were available during my care of the patient were reviewed by me and considered in my medical decision making (see chart for details).    MDM Rules/Calculators/A&P                      34 year old male presents to the emergency department for evaluation of lower extremity pain and swelling with onset yesterday.  He was recently discharged from the hospital following admission for management of hepatitis and LFT elevation.  Noted to be febrile and tachycardic in triage.  Has a new leukocytosis of 15.  Physical exam suggestive of developing cellulitis in the lower extremities.  Do not feel that his symptoms are secondary to DVT or PE; D-dimer is negative.  Did test positive for COVID during his recent admission, but was largely asymptomatic.  Repeat testing not conducted today.  Concern for clinical decompensation and lost to follow-up should patient be discharged as he is homeless.  Started on IV vancomycin, Zosyn.  Will admit to [facility] for ongoing management.  Vitals:   08/15/19 0145 08/15/19 0200 08/15/19 0333 08/15/19 0400  BP: (!) 139/92 137/88  131/72  Pulse: 100 (!) 102  98  Resp:      Temp:   99.4 F (37.4 C)   TempSrc:   Oral   SpO2: 98% 98%  97%  Weight:      Height:        Final Clinical Impression(s) / ED Diagnoses Final diagnoses:  Cellulitis of lower extremity, unspecified laterality    Rx / DC  Orders ED Discharge Orders    None       Antonietta Breach, PA-C 08/15/19 0426    Orpah Greek, MD 08/15/19 [phone]

## 2019-08-15 NOTE — Progress Notes (Signed)
MEWS/VS Documentation      08/15/2019 1900 08/15/2019 1929 08/15/2019 2139 08/15/2019 2200   MEWS Score:  1  0  4  2   MEWS Score Color:  Green  Green  Red  Yellow   Resp:  --  --  (!) 28  19   Pulse:  --  --  97  --   BP:  --  --  (!) 144/79  --   Temp:  --  --  99.8 F (37.7 C)  --   O2 Device:  --  --  Room Air  --   Level of Consciousness:  --  Alert  Alert  --     Pt was in a heated discussion on a phone call with girlfriend when RED MEWS was created. Pt A&Ox4. Respirations are now down around 20. This was short lived. Have notified [facility] as well as night shift on call NP. No new orders received. Will continue to monitor.

## 2019-08-15 NOTE — Progress Notes (Signed)
Pharmacy Antibiotic Note  Albert Blake is a 34 y.o. male admitted on 08/14/2019 with cellulitis.  Pharmacy has been consulted for vancomycin and Zosyn dosing.  Plan: Vancomycin 1500mg  x1 given in ED then 1250mg  IV Q12H. Goal AUC 400-550.  Expected AUC 500.  SCr used 1.38.  Zosyn 3.375g IV Q8H (4-hour infusion).  Height: 6\' 3"  (190.5 cm) Weight: 192 lb (87.1 kg) IBW/kg (Calculated) : 84.5  Temp (24hrs), Avg:99.7 F (37.6 C), Min:99.2 F (37.3 C), Max:100.6 F (38.1 C)  Recent Labs  Lab 08/09/19 0731 08/14/19 2223 08/15/19 0313  WBC  --  15.0*  --   CREATININE 1.05 1.38*  --   LATICACIDVEN  --   --  0.6    Estimated Creatinine Clearance: 90.1 mL/min (A) (by C-G formula based on SCr of 1.38 mg/dL (H)).    No Known Allergies   Thank you for allowing pharmacy to be a part of this patient's care.  08/16/19, PharmD, BCPS  08/15/2019 6:22 AM

## 2019-08-15 NOTE — Significant Event (Signed)
Rapid Response Event Note  Overview: Called d/t RED MEWS-4 for HR-115 and RR-28. Per RN, pt was on phone at the time and after phone call ended, HR and RR decreased back to pt's baseline.  Please call RRT if assistance needed.   Terrilyn Saver

## 2019-08-15 NOTE — Progress Notes (Signed)
Patient admitted for SIRS, arrived into the unit at 38.  Patietn is alert and oriented x 4.  Patient not in distress.  Skin intact . Patient was situated and oriented to the room. Vital signs stable.  Continues on antibiotics vancomycin.  Will continue to monitor

## 2019-08-16 ENCOUNTER — Inpatient Hospital Stay (HOSPITAL_COMMUNITY): Payer: Self-pay

## 2019-08-16 DIAGNOSIS — R609 Edema, unspecified: Secondary | ICD-10-CM

## 2019-08-16 LAB — COMPREHENSIVE METABOLIC PANEL
ALT: 163 U/L — ABNORMAL HIGH (ref 0–44)
AST: 52 U/L — ABNORMAL HIGH (ref 15–41)
Albumin: 2.9 g/dL — ABNORMAL LOW (ref 3.5–5.0)
Alkaline Phosphatase: 131 U/L — ABNORMAL HIGH (ref 38–126)
Anion gap: 13 (ref 5–15)
BUN: 12 mg/dL (ref 6–20)
CO2: 24 mmol/L (ref 22–32)
Calcium: 9.2 mg/dL (ref 8.9–10.3)
Chloride: 103 mmol/L (ref 98–111)
Creatinine, Ser: 1.13 mg/dL (ref 0.61–1.24)
GFR calc Af Amer: >60 mL/min (ref >60)
GFR calc non Af Amer: >60 mL/min (ref >60)
Glucose, Bld: 87 mg/dL (ref 70–99)
Potassium: 3.2 mmol/L — ABNORMAL LOW (ref 3.5–5.1)
Sodium: 140 mmol/L (ref 135–145)
Total Bilirubin: 2.1 mg/dL — ABNORMAL HIGH (ref 0.3–1.2)
Total Protein: 7.3 g/dL (ref 6.5–8.1)

## 2019-08-16 LAB — CBC
HCT: 35.5 % — ABNORMAL LOW (ref 39.0–52.0)
Hemoglobin: 12.2 g/dL — ABNORMAL LOW (ref 13.0–17.0)
MCH: 30.1 pg (ref 26.0–34.0)
MCHC: 34.4 g/dL (ref 30.0–36.0)
MCV: 87.7 fL (ref 80.0–100.0)
Platelets: 316 10*3/uL (ref 150–400)
RBC: 4.05 MIL/uL — ABNORMAL LOW (ref 4.22–5.81)
RDW: 15.2 % (ref 11.5–15.5)
WBC: 11.7 10*3/uL — ABNORMAL HIGH (ref 4.0–10.5)
nRBC: 0 % (ref 0.0–0.2)

## 2019-08-16 MED ORDER — POTASSIUM CHLORIDE CRYS ER 20 MEQ PO TBCR
40.0000 meq | EXTENDED_RELEASE_TABLET | Freq: Once | ORAL | Status: AC
  Administered 2019-08-16: 40 meq via ORAL
  Filled 2019-08-16: qty 2

## 2019-08-16 MED ORDER — VANCOMYCIN HCL 1500 MG/300ML IV SOLN
1500.0000 mg | Freq: Two times a day (BID) | INTRAVENOUS | Status: DC
  Filled 2019-08-16 (×2): qty 300

## 2019-08-16 NOTE — Progress Notes (Signed)
Pharmacy Antibiotic Note  Albert Blake is a 34 y.o. male admitted on 08/14/2019 withbilateral lower extremity cellulitis.  Pharmacy has been consulted for vancomycin dosing.  SCr improving at 1.13 with CrCl 110 mL/min. Will increase Vancomycin.  WBC is trending down but still slightly elevated. BP is stable. Patient with intermittent ST but may be stress/anxiety induced based on notes overnight from RNs. Lactic acid is within normal limits. Fever curve is trending down. Cultures are pending with no growth to date but lab note that excessive volume of blood in vials may impede results.   Plan: Increase Vancomycin to 1500mg  IV Q12H. Goal AUC 400-550.  Expected AUC 500.  SCr used 1.13 Monitor renal function, clinical status, and culture results.   Height: 6\' 3"  (190.5 cm) Weight: 192 lb (87.1 kg) IBW/kg (Calculated) : 84.5  Temp (24hrs), Avg:99.5 F (37.5 C), Min:99.1 F (37.3 C), Max:99.8 F (37.7 C)  Recent Labs  Lab 08/14/19 2223 08/15/19 0313 08/15/19 0652 08/16/19 0422  WBC 15.0*  --  11.0* 11.7*  CREATININE 1.38*  --  1.36* 1.13  LATICACIDVEN  --  0.6  --   --     Estimated Creatinine Clearance: 110.1 mL/min (by C-G formula based on SCr of 1.13 mg/dL).    No Known Allergies   Thank you for allowing pharmacy to be a part of this patient's care.  08/17/19, PharmD, BCPS, BCCCP Clinical Pharmacist Please refer to [facility] for [facility] numbers 08/16/2019 11:43 AM

## 2019-08-16 NOTE — Progress Notes (Signed)
Date: 08/16/2019 Patient: Albert Blake Admitted: 08/14/2019  9:41 PM Attending Provider: Maretta Bees, MD  Albert Blake or his authorized caregiver has made the decision for the patient to leave the hospital against the advice of Albert Blake, Albert Lean, MD.  He or his authorized caregiver has been informed and understands the inherent risks, including death.  He or his authorized caregiver has decided to accept the responsibility for this decision. Albert Blake and all necessary parties have been advised that he may return for further evaluation or treatment. His condition at time of discharge was stable.  Albert Blake had current vital signs as follows:  Blood pressure 133/86, pulse 99, temperature 99.1 F (37.3 C), temperature source Oral, resp. rate (!) 25, height 6\' 3"  (1.905 m), weight 87.1 kg, SpO2 100 %.             Albert Blake or his authorized caregiver has signed the Leaving Against Medical Advice form prior to leaving the department.  [place], RN

## 2019-08-16 NOTE — Discharge Summary (Signed)
PATIENT DETAILS Name: Albert Blake Age: 34 y.o. Sex: male Date of Birth: 14-Jul-1985 MRN: [id number]. Admitting Physician: Eduard Clos, MD GHW:[id number], No Pcp Per  Admit Date: 08/14/2019 Discharge date: 08/16/2019  Note:patient left AMA   Recommendations for Outpatient Follow-up:  1. Please repeat CBC/BMET at next visit 2. Please follow blood/urine cultures till final  PRIMARY DISCHARGE DIAGNOSIS:  Principal Problem:   SIRS (systemic inflammatory response syndrome) (HCC) Active Problems:   Abnormal liver function   Essential hypertension   Cellulitis of lower extremity   ARF (acute renal failure) (HCC)      PAST MEDICAL HISTORY: Past Medical History:  Diagnosis Date  . Acute hepatitis 07/10/2019    ALLERGIES:  No Known Allergies  Brief Narrative: Patient is a 34 y.o. male with PMHx of HTN, recently admitted to [facility] for evaluation of hepatitis thought to be secondary to hepatitis B, polysubstance abuse, perhaps Covid-presented with bilateral lower extremity pain and swelling-thought to have lower extremity cellulitis and admitted to the hospitalist service.  Significant Events: 3/9-3/14>>admit to [facility] hepatitis-Covid 19+ but asymptomatic 3/19>> admit to [facility] for cellulitis 3/21>>Left AMA  Antimicrobial agents: Vancomycin: 3/19>> Zosyn: 3/19>> 3/20  Microbiology data: 3/20: Blood culture>> neg 3/10: COVID-19 positive  Procedures: None  Consults: None  Hospital course by problem list SIRS secondary to bilateral lower extremity cellulitis: Swelling better by the day of discharge AGAINST MEDICAL ADVICE-but still with erythema mostly to his right leg.  Plans are to continue IV vancomycin however patient left AMA.  Lower extremity Dopplers were negative for DVT.    AKI: Resolved.  Appears mild-continue supportive care-suspect this is hemodynamically mediated.   Acute hepatitis: LFTs downtrending compared to his last  admission-thought to be secondary to mostly acute hepatitis B.  Avoid hepatotoxic agents.  Polysubstance abuse/alcohol abuse: Per H&P-apparently has not used drugs since his discharge from his most recent hospitalization.  Drug screen on 3/20 was positive for amphetamines, cocaine and cannabinoid.  He had no signs of drug withdrawal during this hospital stay.   COVID-19 infection: Appears asymptomatic-no need for treatment.  HTN: BP controlled-continue amlodipine  PERTINENT RADIOLOGIC STUDIES: CT ABDOMEN PELVIS W CONTRAST  Result Date: 08/05/2019 CLINICAL DATA:  Acute abdominal pain and vomiting. EXAM: CT ABDOMEN AND PELVIS WITH CONTRAST TECHNIQUE: Multidetector CT imaging of the abdomen and pelvis was performed using the standard protocol following bolus administration of intravenous contrast. CONTRAST:  93mL OMNIPAQUE IOHEXOL 300 MG/ML  SOLN COMPARISON:  Abdominal ultrasound 07/10/2019 FINDINGS: Lower chest: The lung bases are clear of acute process. No pleural effusion or pulmonary lesions. The heart is normal in size. No pericardial effusion. The distal esophagus and aorta are unremarkable. Hepatobiliary: No focal hepatic lesions or intrahepatic biliary dilatation. Decreased attenuation and slightly heterogeneous appearance of the liver could be due to fatty infiltration or hepatic disease such as hepatitis. The gallbladder is not distended but there is mucosal enhancement and pericholecystic fluid or diffuse edema in the gallbladder wall. No obvious gallstones by CT. No gallstones seen on recent ultrasound either. Normal caliber and course of the common bile duct. Pancreas: No mass, inflammation or ductal dilatation. Spleen: Normal size. No focal lesions. Adrenals/Urinary Tract: The adrenal glands and kidneys are unremarkable. The bladder is normal. Stomach/Bowel: The stomach, duodenum, small bowel and colon are grossly normal without oral contrast. No inflammatory changes, mass lesions or  obstructive findings. The appendix is normal. Vascular/Lymphatic: The aorta is normal in caliber. No dissection. The branch vessels are patent. The major venous  structures are patent. No mesenteric or retroperitoneal mass or adenopathy. Small scattered lymph nodes are noted. Reproductive: The prostate gland and seminal vesicles are unremarkable. Other: No pelvic mass or adenopathy. No free pelvic fluid collections. No inguinal mass or adenopathy. No abdominal wall hernia or subcutaneous lesions. Musculoskeletal: No significant bony findings. Small symmetric largely sclerotic bone lesions in both iliac bones, likely small bone infarcts. IMPRESSION: 1. Thickened gallbladder wall and or pericholecystic fluid could be due to acalculus cholecystitis or underlying parenchymal liver disease and low albumin. 2. Decreased attenuation and slight heterogeneous appearance of the liver may suggest underlying liver disease/hepatitis. 3. No other significant acute abdominal/pelvic findings, mass lesions or adenopathy. Electronically Signed   By: Rudie Meyer M.D.   On: 08/05/2019 05:46   VAS [place] LOWER EXTREMITY VENOUS (DVT)  Result Date: 08/16/2019  Lower Venous DVTStudy Indications: Pain, Edema, and Covid-19, cellulitis.  Comparison Study: No prior study on file Performing Technologist: Sherren Kerns RVS  Examination Guidelines: A complete evaluation includes B-mode imaging, spectral Doppler, color Doppler, and power Doppler as needed of all accessible portions of each vessel. Bilateral testing is considered an integral part of a complete examination. Limited examinations for reoccurring indications may be performed as noted. The reflux portion of the exam is performed with the patient in reverse Trendelenburg.  +---------+---------------+---------+-----------+----------+--------------+ RIGHT    CompressibilityPhasicitySpontaneityPropertiesThrombus Aging  +---------+---------------+---------+-----------+----------+--------------+ CFV      Full           Yes      Yes                                 +---------+---------------+---------+-----------+----------+--------------+ SFJ      Full                                                        +---------+---------------+---------+-----------+----------+--------------+ FV Prox  Full                                                        +---------+---------------+---------+-----------+----------+--------------+ FV Mid   Full                                                        +---------+---------------+---------+-----------+----------+--------------+ FV DistalFull                                                        +---------+---------------+---------+-----------+----------+--------------+ PFV      Full                                                        +---------+---------------+---------+-----------+----------+--------------+ POP  Full           Yes      Yes                                 +---------+---------------+---------+-----------+----------+--------------+ PTV      Full                                                        +---------+---------------+---------+-----------+----------+--------------+ PERO     Full                                                        +---------+---------------+---------+-----------+----------+--------------+   +---------+---------------+---------+-----------+----------+--------------+ LEFT     CompressibilityPhasicitySpontaneityPropertiesThrombus Aging +---------+---------------+---------+-----------+----------+--------------+ CFV      Full           Yes      Yes                                 +---------+---------------+---------+-----------+----------+--------------+ SFJ      Full                                                         +---------+---------------+---------+-----------+----------+--------------+ FV Prox  Full                                                        +---------+---------------+---------+-----------+----------+--------------+ FV Mid   Full                                                        +---------+---------------+---------+-----------+----------+--------------+ FV DistalFull                                                        +---------+---------------+---------+-----------+----------+--------------+ PFV      Full                                                        +---------+---------------+---------+-----------+----------+--------------+ POP      Full           Yes      Yes                                 +---------+---------------+---------+-----------+----------+--------------+  PTV      Full                                                        +---------+---------------+---------+-----------+----------+--------------+ PERO     Full                                                        +---------+---------------+---------+-----------+----------+--------------+     Summary: BILATERAL: - No evidence of deep vein thrombosis seen in the lower extremities, bilaterally.  RIGHT: - Ultrasound characteristics of enlarged lymph nodes are noted in the groin.  LEFT: - Ultrasound characteristics of enlarged lymph nodes noted in the groin.  *See table(s) above for measurements and observations. Electronically signed by Ruta Hinds MD on 08/16/2019 at 12:28:29 PM.    Final    US Abdomen Limited RUQ  Result Date: 08/05/2019 CLINICAL DATA:  Abdominal pain EXAM: ULTRASOUND ABDOMEN LIMITED RIGHT UPPER QUADRANT COMPARISON:  CT from yesterday FINDINGS: Gallbladder: Circumferential thickening without striation or pericholecystic edema. No gallstone or focal tenderness Common bile duct: Diameter: 2 mm.  Where visualized, no filling defect. Liver: No focal lesion  identified. Within normal limits in parenchymal echogenicity. Portal vein is patent on color Doppler imaging with normal direction of blood flow towards the liver. IMPRESSION: Gallbladder wall thickening without stone or focal tenderness, considered reactive to the patient's hepatitis. Electronically Signed   By: Monte Fantasia M.D.   On: 08/05/2019 08:54     PERTINENT LAB RESULTS: CBC: Recent Labs    08/15/19 0652 08/16/19 0422  WBC 11.0* 11.7*  HGB 11.7* 12.2*  HCT 35.0* 35.5*  PLT 276 316   CMET CMP     Component Value Date/Time   NA 140 08/16/2019 0422   K 3.2 (L) 08/16/2019 0422   CL 103 08/16/2019 0422   CO2 24 08/16/2019 0422   GLUCOSE 87 08/16/2019 0422   BUN 12 08/16/2019 0422   CREATININE 1.13 08/16/2019 0422   CALCIUM 9.2 08/16/2019 0422   PROT 7.3 08/16/2019 0422   ALBUMIN 2.9 (L) 08/16/2019 0422   AST 52 (H) 08/16/2019 0422   ALT 163 (H) 08/16/2019 0422   ALKPHOS 131 (H) 08/16/2019 0422   BILITOT 2.1 (H) 08/16/2019 0422   GFRNONAA >60 08/16/2019 0422   GFRAA >60 08/16/2019 0422    GFR Estimated Creatinine Clearance: 110.1 mL/min (by C-G formula based on SCr of 1.13 mg/dL). No results for input(s): LIPASE, AMYLASE in the last 72 hours. No results for input(s): CKTOTAL, CKMB, CKMBINDEX, TROPONINI in the last 72 hours. Invalid input(s): POCBNP Recent Labs    08/15/19 0017  DDIMER 0.43   No results for input(s): HGBA1C in the last 72 hours. No results for input(s): CHOL, HDL, LDLCALC, TRIG, CHOLHDL, LDLDIRECT in the last 72 hours. No results for input(s): TSH, T4TOTAL, T3FREE, THYROIDAB in the last 72 hours.  Invalid input(s): FREET3 No results for input(s): VITAMINB12, FOLATE, FERRITIN, TIBC, IRON, RETICCTPCT in the last 72 hours. Coags: Recent Labs    08/15/19 0017  INR 1.2   Microbiology: Recent Results (from the past 240 hour(s))  Culture, blood (Routine X 2) w Reflex to ID Panel  Status: None (Preliminary result)   Collection Time:  08/15/19  3:45 AM   Specimen: BLOOD  Result Value Ref Range Status   Specimen Description BLOOD RIGHT VEIN  Final   Special Requests   Final    BOTTLES DRAWN AEROBIC AND ANAEROBIC Blood Culture results may not be optimal due to an excessive volume of blood received in culture bottles   Culture   Final    NO GROWTH 1 DAY Performed at [facility], [address] [place], [place] [address]    Report Status PENDING  Incomplete  Culture, blood (Routine X 2) w Reflex to ID Panel     Status: None (Preliminary result)   Collection Time: 08/15/19  3:45 AM   Specimen: BLOOD  Result Value Ref Range Status   Specimen Description BLOOD LEFT VEIN  Final   Special Requests   Final    BOTTLES DRAWN AEROBIC AND ANAEROBIC Blood Culture results may not be optimal due to an excessive volume of blood received in culture bottles   Culture   Final    NO GROWTH 1 DAY Performed at [facility], [address] [place], [place] [address]    Report Status PENDING  Incomplete      TODAY-DAY OF DISCHARGE:  Subjective:   Jailin [place] today has signed out against medical advice. He was warned about the life threatening and life disabling effects by  RN.   Objective:   Blood pressure 133/86, pulse 99, temperature 99.1 F (37.3 C), temperature source Oral, resp. rate (!) 25, height 6\' 3"  (1.905 m), weight 87.1 kg, SpO2 100 %.   DISCHARGE CONDITION: Not stable for discharge-left AMA  DISPOSITION: AMA   Follow with your PCP in 1 week   Total Time spent on discharge equals 25  minutes.  Signed: [place] Yearby 08/16/2019 1:16 PM

## 2019-08-20 LAB — CULTURE, BLOOD (ROUTINE X 2)
Culture: NO GROWTH
Culture: NO GROWTH

## 2019-08-22 ENCOUNTER — Ambulatory Visit (HOSPITAL_COMMUNITY)
Admission: RE | Admit: 2019-08-22 | Discharge: 2019-08-22 | Disposition: A | Discharge status: Home / Self Care | Payer: Self-pay | Attending: Psychiatry | Admitting: Psychiatry

## 2019-08-22 DIAGNOSIS — Z1389 Encounter for screening for other disorder: Secondary | ICD-10-CM | POA: Insufficient documentation

## 2019-08-22 DIAGNOSIS — I10 Essential (primary) hypertension: Secondary | ICD-10-CM | POA: Insufficient documentation

## 2019-08-22 DIAGNOSIS — Z915 Personal history of self-harm: Secondary | ICD-10-CM | POA: Insufficient documentation

## 2019-08-22 DIAGNOSIS — Z590 Homelessness: Secondary | ICD-10-CM | POA: Insufficient documentation

## 2019-08-22 DIAGNOSIS — F329 Major depressive disorder, single episode, unspecified: Secondary | ICD-10-CM | POA: Insufficient documentation

## 2019-08-22 NOTE — H&P (Signed)
Behavioral Health Medical Screening Exam  Albert Blake is an 34 y.o. male.  Patient presents voluntarily for walk-in assessment to [facility] behavioral health.  Patient assessed by nurse practitioner.  Patient alert and oriented, answers appropriately.  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  Patient reports "I use alcohol every now and then but I have a bad liver, hep B from a bad needle so I do not use much alcohol."  Patient reports he would like to attend rehab for 28 days, patient has recently been to rehab at [facility] in December 2020.  Patient reports he was seen at [facility] today but "I did not meet their criteria."  Patient also spoke with [place] today and states they will call him back today. Patient denies suicidal and homicidal ideations.  Patient denies history of self-harm, denies history of suicide attempts.  Patient denies auditory and visual hallucinations.  Patient reports symptoms of paranoia when using substance.  Patient reports followed outpatient by [facility] [place], sees them approximately once a month.  Patient reports history of anxiety and depression, unable to recall any medications prescribed. Patient reports he is homeless but stays at different places.  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  Patient denies access to weapons.  Patient reports his girlfriend recommended that he come in to get help today but reports he also would like to stop using substance.  Patient reports average appetite but poor sleep states "I worry about where I will sleep."    Total Time spent with patient: 20 minutes  Psychiatric Specialty Exam: Physical Exam  Nursing note and vitals reviewed. Constitutional: He is oriented to person, place, and time. He appears well-developed.  HENT:  Head: Normocephalic.  Cardiovascular: Normal  rate.  Respiratory: Effort normal.  Neurological: He is alert and oriented to person, place, and time.  Psychiatric: He has a normal mood and affect. His speech is normal and behavior is normal. Judgment and thought content normal. Cognition and memory are normal.    Review of Systems  Constitutional: Negative.   HENT: Negative.   Eyes: Negative.   Respiratory: Negative.   Cardiovascular: Negative.   Gastrointestinal: Negative.   Genitourinary: Negative.   Musculoskeletal: Negative.   Skin: Negative.   Neurological: Negative.   Psychiatric/Behavioral: Positive for sleep disturbance.    Blood pressure (!) 146/102, pulse 83, temperature 98.2 F (36.8 C), temperature source Oral, resp. rate 20, SpO2 100 %.There is no height or weight on file to calculate BMI.  General Appearance: Casual  Eye Contact:  Good  Speech:  Clear and Coherent and Normal Rate  Volume:  Normal  Mood:  Depressed  Affect:  Appropriate and Congruent  Thought Process:  Coherent, Goal Directed and Descriptions of Associations: Intact  Orientation:  Full (Time, Place, and Person)  Thought Content:  WDL and Logical  Suicidal Thoughts:  No  Homicidal Thoughts:  No  Memory:  Immediate;   Good Recent;   Good Remote;   Good  Judgement:  Good  Insight:  Good  Psychomotor Activity:  Normal  Concentration: Concentration: Good and Attention Span: Good  Recall:  Good  Fund of Knowledge:Good  Language: Good  Akathisia:  No  Handed:  Right  AIMS (if indicated):     Assets:  Communication Skills Desire for Improvement Financial Resources/Insurance Housing Intimacy  Leisure Time Physical Health Resilience Social Support  Sleep:       Musculoskeletal: Strength & Muscle Tone: within normal limits Gait & Station: normal Patient leans: N/A  Blood pressure (!) 146/102, pulse 83, temperature 98.2 F (36.8 C), temperature source Oral, resp. rate 20, SpO2 100 %.  Recommendations: Patient given list of  outpatient substance use treatment resources as requested.  Patient encouraged to follow-up with primary care or the emergency department regarding elevated blood pressure.  Based on my evaluation the patient does not appear to have an emergency medical condition.  Patrcia Dolly, FNP 08/22/2019, 4:13 PM

## 2019-08-22 NOTE — BH Assessment (Signed)
Assessment Note  Albert Blake is an 34 y.o. male. He presents to [facility] as a walk-in. Patient is voluntary and was transported to [facility] by his girlfriend. He is currently homeless and states that he wants detox/residential treatment. Patient reports is most significant use is with crystal meth and heroin (IV use) over the past 8 months daily. He has a history of cocaine use but states he hasn't used in the past week and doesn't use as frequently. Per his history he also uses THC and opiates. He noted some alcohol use but stays away from alcohol use due to liver issues. 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 Patient notes withdrawal symptoms: agitation, muscle cramps, and mood changes-anger/irriatability. Patient has received detox/residential services in the past at [facility]. No SI. No history of suicidal ideations and/or self mutilating behaviors. Patient with significant history of depression: loss of interest in usual pleasures, crying spells,  No HI. No history of harm to others. Patient was in prison for 4 yrs due to B&E. Patient released from prison approx. 8 months ago. Stress includes homeless and not knowing where he will be able to sleep. Therefore, sleep is poor. Appetite is poor. Patient was oriented to time, person, place, and situation. Speech was normal. Eye contact was fair. Insight and judgement was fair. He was dressed casually.     Diagnosis: Major Depressive Disorder and Substance Use Disorder  Past Medical History:  Past Medical History:  Diagnosis Date  . Acute hepatitis 07/10/2019    No past surgical history on file.  Family History:  Family History  Problem Relation Age of Onset  . Diabetes Mother     Social History:  reports that he has been smoking cigarettes. He has been smoking about 1.00 pack per day. He has never used smokeless tobacco. He reports current alcohol use. He reports current drug use. Drugs: Cocaine and Marijuana.  Additional  Social History:  Alcohol / Drug Use Pain Medications: See MAR Abuses pain medications when he has access Prescriptions: See MAR- Vistaril, Amlodipine, uses prescription drugs whenever he can (xanax) Over the Counter: NA  History of alcohol / drug use?: Yes Longest period of sobriety (when/how long): 4 years- (was in prison)  Substance #1 Name of Substance 1: heroin 1 - Age of First Use: 8 months ago; 06-2019 1 - Amount (size/oz): 2 shots per day 1 - Frequency: daily 1 - Duration: 8 months 1 - Last Use / Amount: 2 shots per day Substance #2 Name of Substance 2: THC 2 - Age of First Use: 16  2 - Amount (size/oz): 4 blunts 2 - Frequency: daily  2 - Duration: on and off since age 53 2 - Last Use / Amount: Unknown Substance #3 Name of Substance 3: Cocaine  3 - Age of First Use: 16  3 - Amount (size/oz): unknown 3 - Frequency: daily  3 - Last Use / Amount: 1 week ago Substance #5 Name of Substance 5: Opiates  5 - Age of First Use: 29 5 - Amount (size/oz): unknown (whatever he can get) 5 - Frequency: daily when he can get it 5 - Duration: 2 years  5 - Last Use / Amount: unknown Substance #6 Name of Substance 6: Crystal Meth 6 - Age of First Use: 8 months ago; 34 yrs old 6 - Amount (size/oz): "too much" 6 - Frequency: daily 6 - Duration: 8 months 6 - Last Use / Amount: 2 days ago  CIWA: CIWA-Ar BP: (!) 146/102 Pulse Rate: 83 COWS:    Allergies: No Known Allergies  Home Medications: (Not in a hospital admission)   OB/GYN Status:  No LMP for male patient.  General Assessment Data Location of Assessment: [facility] Assessment Services TTS Assessment: In system Is this a Tele or Face-to-Face Assessment?: Face-to-Face Is this an Initial Assessment or a Re-assessment for this encounter?: Initial Assessment Patient Accompanied by:: Other Language Other than English: No Living Arrangements: Other (Comment) What gender do you identify as?: Male Marital status: Single Maiden  name: (n/a) Pregnancy Status: No Living Arrangements: (girl friend ) Can pt return to current living arrangement?: Yes Admission Status: Voluntary Is patient capable of signing voluntary admission?: Yes Referral Source: Self/Family/Friend Insurance type: (None )     Crisis Care Plan Living Arrangements: (girl friend ) [employer] operational [employer] Guardian: (no legal guardian) Name of Psychiatrist: (no psychiatrist ) Name of Therapist: [facility] at [facility] x 5 months  Education Status Is patient currently in school?: No Is the patient employed, unemployed or receiving disability?: Unemployed  Risk to self with the past 6 months Suicidal Ideation: No Has patient been a risk to self within the past 6 months prior to admission? : No Suicidal Intent: No Has patient had any suicidal intent within the past 6 months prior to admission? : No Is patient at risk for suicide?: No Suicidal Plan?: No Has patient had any suicidal plan within the past 6 months prior to admission? : No Access to Means: No What has been your use of drugs/alcohol within the last 12 months?: (daily use of crystal meth and heroin; also uses cocaine ) Previous Attempts/Gestures: No How many times?: (0) Other Self Harm Risks: (substance use disorder; depression symptoms ) Triggers for Past Attempts: (no triggers for past attempts/gestures ) Intentional Self Injurious Behavior: None Family Suicide History: No Recent stressful life event(s): Recent negative physical changes(health issues ) Persecutory voices/beliefs?: No Depression: Yes Depression Symptoms: Feeling angry/irritable, Feeling worthless/self pity, Loss of interest in usual pleasures, Guilt, Fatigue, Isolating, Tearfulness Substance abuse history and/or treatment for substance abuse?: Yes Suicide prevention information given to non-admitted patients: Not applicable  Risk to Others within the past 6 months Homicidal Ideation: No Does patient have any lifetime risk of  violence toward others beyond the six months prior to admission? : No Thoughts of Harm to Others: No Current Homicidal Intent: No Current Homicidal Plan: No Access to Homicidal Means: No Identified Victim: n/a History of harm to others?: No Assessment of Violence: None Noted Violent Behavior Description: (cc) Does patient have access to weapons?: No Criminal Charges Pending?: No Does patient have a court date: No Is patient on probation?: No  Psychosis Hallucinations: None noted Delusions: None noted  Mental Status Report Appearance/Hygiene: Disheveled Eye Contact: Fair Motor Activity: Freedom of movement Speech: Soft Level of Consciousness: Quiet/awake Mood: Pleasant Affect: Constricted Thought Processes: Coherent, Relevant Judgement: Partial Orientation: Appropriate for developmental age Obsessive Compulsive Thoughts/Behaviors: None  Cognitive Functioning Concentration: Good Memory: Recent Intact, Remote Intact Is patient IDD: No Insight: Good Impulse Control: Fair Appetite: Fair Have you had any weight changes? : No Change Sleep: Decreased Total Hours of Sleep: (6 hrs per night) Vegetative Symptoms: None  ADLScreening [facility] Assessment Services) Patient's cognitive ability adequate to safely complete daily activities?: Yes Patient able to express need for assistance with ADLs?: Yes Independently performs ADLs?: Yes (appropriate for developmental age)  Prior Inpatient Therapy Prior Inpatient Therapy: Yes Prior Therapy Dates: 2020 Prior Therapy Facilty/Provider(s): [facility] Reason  for Treatment: substance abuse  Prior Outpatient Therapy Prior Outpatient Therapy: Yes Prior Therapy Dates: ongoing x 5 months Prior Therapy Facilty/Provider(s): [facility] Reason for Treatment: depression Does patient have an ACCT team?: No Does patient have Intensive In-House Services?  : No Does patient have [facility] services? : No Does patient have P4CC services?:  No  ADL Screening (condition at time of admission) Patient's cognitive ability adequate to safely complete daily activities?: Yes Is the patient deaf or have difficulty hearing?: No Does the patient have difficulty seeing, even when wearing glasses/contacts?: No Does the patient have difficulty concentrating, remembering, or making decisions?: No Patient able to express need for assistance with ADLs?: Yes Does the patient have difficulty dressing or bathing?: No Independently performs ADLs?: Yes (appropriate for developmental age) Does the patient have difficulty walking or climbing stairs?: No Weakness of Legs: None Weakness of Arms/Hands: None  Home Assistive Devices/Equipment Home Assistive Devices/Equipment: None  Therapy Consults (therapy consults require a physician order) PT Evaluation Needed: No OT Evalulation Needed: No SLP Evaluation Needed: No         Nutrition Screen- [facility] Adult/WL/AP Patient's home diet: Regular Has the patient recently lost weight without trying?: No Has the patient been eating poorly because of a decreased appetite?: No Malnutrition Screening Tool Score: 0        Disposition: Per Letitia Libra, NP, patient is psych cleared. Patient discharged and provided referrals to substance use facilities: [facility], [facility], [facility]. Also, recommended that patient follow up with current outpatient therapist/psychiatrist -[facility].  Disposition Initial Assessment Completed for this Encounter: Yes Disposition of Patient: Discharge Mode of transportation if patient is discharged/movement?: Car Patient referred to: [facility], [facility], Other (Comment)([facility])  On Site Evaluation by:   Reviewed with Physician:    Waldon Merl 08/22/2019 4:46 PM

## 2019-12-06 ENCOUNTER — Other Ambulatory Visit: Payer: Self-pay

## 2019-12-06 ENCOUNTER — Emergency Department (HOSPITAL_COMMUNITY)
Admission: EM | Admit: 2019-12-06 | Discharge: 2019-12-06 | Disposition: A | Discharge status: Home / Self Care | Payer: Self-pay | Attending: Emergency Medicine | Admitting: Emergency Medicine

## 2019-12-06 DIAGNOSIS — F1910 Other psychoactive substance abuse, uncomplicated: Secondary | ICD-10-CM | POA: Insufficient documentation

## 2019-12-06 DIAGNOSIS — T401X1A Poisoning by heroin, accidental (unintentional), initial encounter: Secondary | ICD-10-CM | POA: Insufficient documentation

## 2019-12-06 DIAGNOSIS — I10 Essential (primary) hypertension: Secondary | ICD-10-CM | POA: Insufficient documentation

## 2019-12-06 DIAGNOSIS — Z79899 Other long term (current) drug therapy: Secondary | ICD-10-CM | POA: Insufficient documentation

## 2019-12-06 DIAGNOSIS — F17210 Nicotine dependence, cigarettes, uncomplicated: Secondary | ICD-10-CM | POA: Insufficient documentation

## 2019-12-06 LAB — RAPID URINE DRUG SCREEN, HOSP PERFORMED
Amphetamines: POSITIVE — AB
Barbiturates: NOT DETECTED
Benzodiazepines: NOT DETECTED
Cocaine: POSITIVE — AB
Opiates: NOT DETECTED
Tetrahydrocannabinol: POSITIVE — AB

## 2019-12-06 LAB — I-STAT CHEM 8, ED
BUN: 14 mg/dL (ref 6–20)
Calcium, Ion: 0.99 mmol/L — ABNORMAL LOW (ref 1.15–1.40)
Chloride: 107 mmol/L (ref 98–111)
Creatinine, Ser: 1.7 mg/dL — ABNORMAL HIGH (ref 0.61–1.24)
Glucose, Bld: 109 mg/dL — ABNORMAL HIGH (ref 70–99)
HCT: 38 % — ABNORMAL LOW (ref 39.0–52.0)
Hemoglobin: 12.9 g/dL — ABNORMAL LOW (ref 13.0–17.0)
Potassium: 3.4 mmol/L — ABNORMAL LOW (ref 3.5–5.1)
Sodium: 141 mmol/L (ref 135–145)
TCO2: 25 mmol/L (ref 22–32)

## 2019-12-06 MED ORDER — SODIUM CHLORIDE 0.9 % IV BOLUS
2000.0000 mL | Freq: Once | INTRAVENOUS | Status: AC
  Administered 2019-12-06: 2000 mL via INTRAVENOUS

## 2019-12-06 NOTE — ED Triage Notes (Signed)
Patient arrives via EMS due to having a suspected overdose. Per ems, bystanders found the patient laid out on the street. When ems arrives, he was apneic on the scene, with pinpoint pupils.  Patient was initially apneic with saturations around 45%. Patient was given 0.5 of Narcan by the fire department and 0.4 of Narcan by ems.   Saturations increased to 97% on [employer].  CBG 123, Temp 98.4. Patient received 1L of NS en route.

## 2019-12-06 NOTE — ED Provider Notes (Signed)
[facility] EMERGENCY DEPARTMENT Provider Note   CSN: [id number] Arrival date & time: 12/06/19  1543     History Chief Complaint  Patient presents with  . Drug Overdose    Albert Blake is a 34 y.o. male.  HPI    34 year old male history of renal failure, hep B, and polysubstance abuse presents today after being found on the side of the road.  He had pinpoint pupils.  He had decreased respirations.  Prehospital he received Narcan.  He is currently awake and alert without complaints. Past Medical History:  Diagnosis Date  . Acute hepatitis 07/10/2019    Patient Active Problem List   Diagnosis Date Noted  . Cellulitis of lower extremity 08/15/2019  . ARF (acute renal failure) (HCC) 08/15/2019  . SIRS (systemic inflammatory response syndrome) (HCC) 08/15/2019  . Hep B w/o coma 08/05/2019  . Essential hypertension 08/05/2019  . Polysubstance (excluding opioids) dependence (HCC) 08/05/2019  . Abdominal pain 08/05/2019  . Abnormal liver function 07/10/2019  . Rhabdomyolysis 07/10/2019    No past surgical history on file.     Family History  Problem Relation Age of Onset  . Diabetes Mother     Social History   Tobacco Use  . Smoking status: Current Every Day Smoker    Packs/day: 1.00    Types: Cigarettes  . Smokeless tobacco: Never Used  Vaping Use  . Vaping Use: Former  Substance Use Topics  . Alcohol use: Yes    Comment: pt states, " I drink about 2 bottles a day."  . Drug use: Yes    Types: Cocaine, Marijuana    Home Medications Prior to Admission medications   Medication Sig Start Date End Date Taking? Authorizing Provider  amLODipine (NORVASC) 10 MG tablet Take 1 tablet (10 mg total) by mouth daily. 08/07/19   Elgergawy, Leana Roe, MD  folic acid (FOLVITE) 1 MG tablet Take 1 tablet (1 mg total) by mouth daily. 08/08/19   Elgergawy, Leana Roe, MD  hydrALAZINE (APRESOLINE) 50 MG tablet Take 2 tablets (100 mg total) by mouth 3 (three)  times daily. 08/09/19   Elgergawy, Leana Roe, MD  thiamine 100 MG tablet Take 1 tablet (100 mg total) by mouth daily. 08/08/19   Elgergawy, Leana Roe, MD    Allergies    Patient has no known allergies.  Review of Systems   Review of Systems  All other systems reviewed and are negative.   Physical Exam Updated Vital Signs BP (!) 144/106   Pulse 78   Temp 97.6 F (36.4 C) (Oral)   Resp 12   Ht 1.905 m (6\' 3" )   SpO2 100%   BMI 24.00 kg/m   Physical Exam Vitals and nursing note reviewed.  Constitutional:      Appearance: He is well-developed.  HENT:     Head: Normocephalic and atraumatic.     Right Ear: External ear normal.     Left Ear: External ear normal.     Nose: Nose normal.  Eyes:     Conjunctiva/sclera: Conjunctivae normal.     Pupils: Pupils are equal, round, and reactive to light.  Cardiovascular:     Rate and Rhythm: Normal rate and regular rhythm.     Heart sounds: Normal heart sounds.  Pulmonary:     Effort: Pulmonary effort is normal. No respiratory distress.     Breath sounds: Normal breath sounds. No wheezing.  Chest:     Chest wall: No tenderness.  Abdominal:  General: Bowel sounds are normal. There is no distension.     Palpations: Abdomen is soft. There is no mass.     Tenderness: There is no abdominal tenderness. There is no guarding.  Musculoskeletal:        General: Normal range of motion.     Cervical back: Normal range of motion and neck supple.  Skin:    General: Skin is warm and dry.  Neurological:     Mental Status: He is alert and oriented to person, place, and time.     Motor: No abnormal muscle tone.     Coordination: Coordination normal.     Deep Tendon Reflexes: Reflexes are normal and symmetric.  Psychiatric:        Behavior: Behavior normal.        Thought Content: Thought content normal.        Judgment: Judgment normal.     ED Results / Procedures / Treatments   Labs (all labs ordered are listed, but only abnormal  results are displayed) Labs Reviewed  RAPID URINE DRUG SCREEN, HOSP PERFORMED - Abnormal; Notable for the following components:      Result Value   Cocaine POSITIVE (*)    Amphetamines POSITIVE (*)    Tetrahydrocannabinol POSITIVE (*)    All other components within normal limits  I-STAT CHEM 8, ED - Abnormal; Notable for the following components:   Potassium 3.4 (*)    Creatinine, Ser 1.70 (*)    Glucose, Bld 109 (*)    Calcium, Ion 0.99 (*)    Hemoglobin 12.9 (*)    HCT 38.0 (*)    All other components within normal limits    EKG EKG Interpretation  Date/Time:  Sunday December 06 2019 15:50:01 EDT Ventricular Rate:  95 PR Interval:    QRS Duration: 105 QT Interval:  410 QTC Calculation: 516 R Axis:   76 Text Interpretation: Sinus rhythm Probable left atrial enlargement RSR' in V1 or V2, probably normal variant Left ventricular hypertrophy Prolonged QT interval No significant change since last tracing Confirmed by Margarita Grizzle [phone]) on 12/06/2019 6:01:03 PM   Radiology No results found.  Procedures Procedures (including critical care time)  Medications Ordered in ED Medications  sodium chloride 0.9 % bolus 2,000 mL (2,000 mLs Intravenous New Bag/Given 12/06/19 1610)    ED Course  I have reviewed the triage vital signs and the nursing notes.  Pertinent labs & imaging results that were available during my care of the patient were reviewed by me and considered in my medical decision making (see chart for details).  6:01 PM Uncle at bedside.  On-call states that the patient thought to be dead earlier.  He said the police came to him and told him that he was dead.  He came up here and found him alive.  He is talking to the patient stating that he needs to get his life together.  We have discussed that we would be happy to give him referrals if he wishes.  The patient has not voiced any desire at this time to stop using.    6:05 PM Patient continues awake and alert.  He has  had no further Narcan.  I reviewed his labs.  He is received 2 L of normal saline.  His creatinine is increased today at 1.7.  I suspect he has some volume depletion as he has been outside in the heat and not taking p.o. well.  He is taking p.o. here without difficulty and  requesting more fluid.  He received a 2 L normal saline.  I discussed these labs.  I have given him ample opportunity to request further assistance with his addiction problems.  He does not voice any wishes to pursue this at this time. MDM Rules/Calculators/A&P                              Final Clinical Impression(s) / ED Diagnoses Final diagnoses:  Polysubstance abuse (HCC)  Accidental overdose of heroin, initial encounter [facility])    Rx / DC Orders ED Discharge Orders    None       Margarita Grizzle, MD 12/06/19 6378

## 2019-12-06 NOTE — ED Notes (Signed)
Pt verbalized understanding of d/c instructions, follow up care and s/s requiring return to ed. Discussed with pt about resources in area that he could receive help with substance abuse. Time was given for pt to contact resources however pt refused and stated he would contact them later. Pt informed of serious nature of substance abuse and need for help in recovery. Pt transported to exit via wheelchair.

## 2019-12-06 NOTE — Discharge Instructions (Addendum)
Your creatinine is elevated today.  You received 2 L of normal saline.  Please drink plenty of fluids.  Have your creatinine followed up as an outpatient. If you decide that you wish assistance with your substance abuse problems, you have been given a resource guide.  You can also return to the emergency department anytime if you have worsening problems or wish for further assistance

## 2021-07-09 IMAGING — US US ABDOMEN LIMITED
1 series · 13 of 25 positions shown · non-contrast
Comparison: None.

CLINICAL DATA: Elevated LFTs.  Acute hepatitis.

EXAM:
ULTRASOUND ABDOMEN LIMITED RIGHT UPPER QUADRANT

[Series 1: us abdomen limited · 13 of 51 slices shown]
[im 1/51]
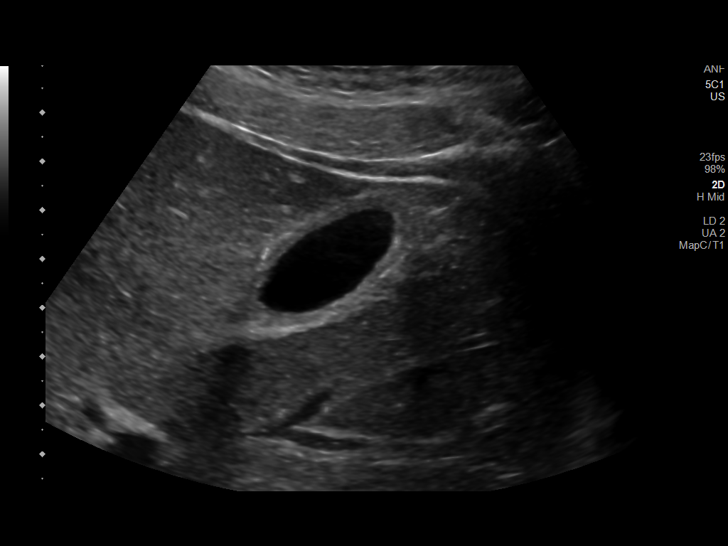
[im 5/51]
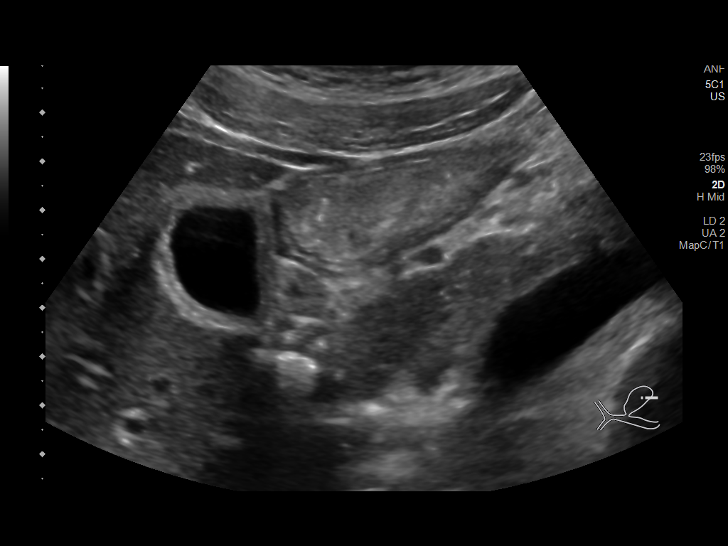
[im 9/51]
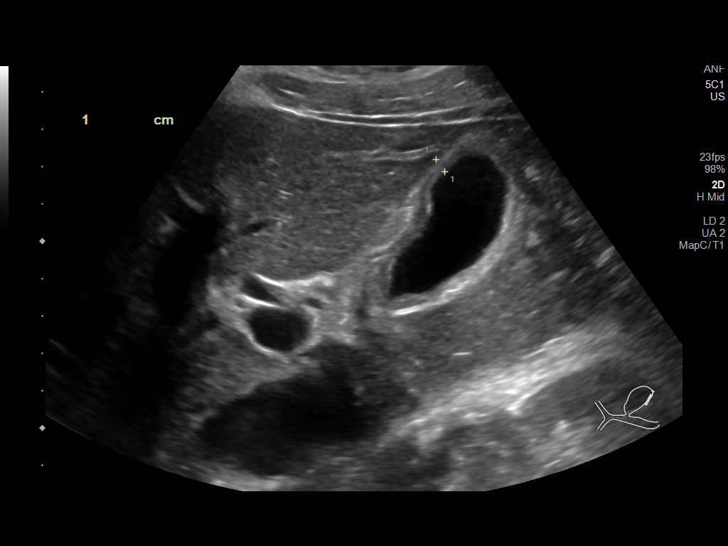
[im 13/51]
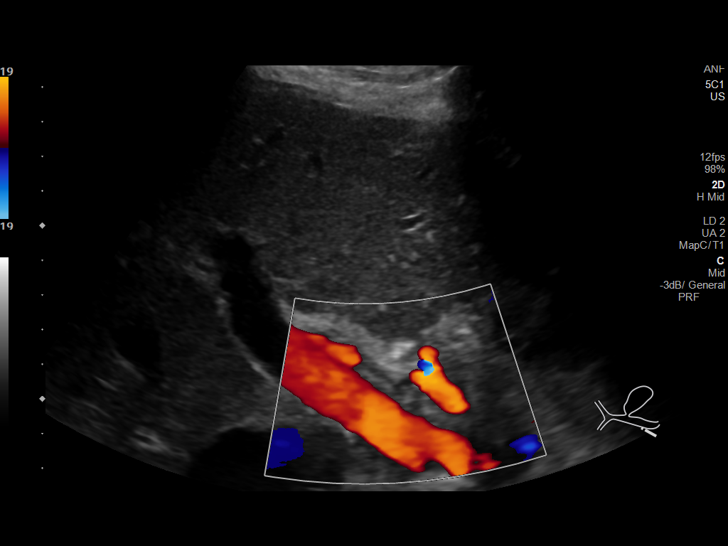
[im 17/51]
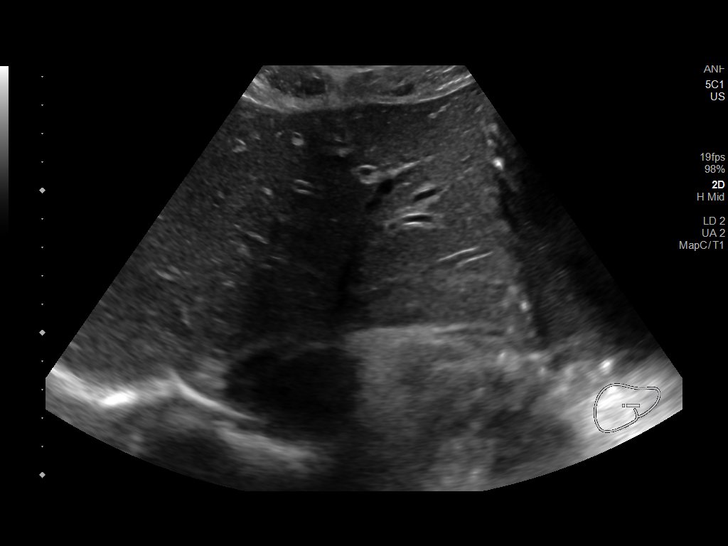
[im 21/51]
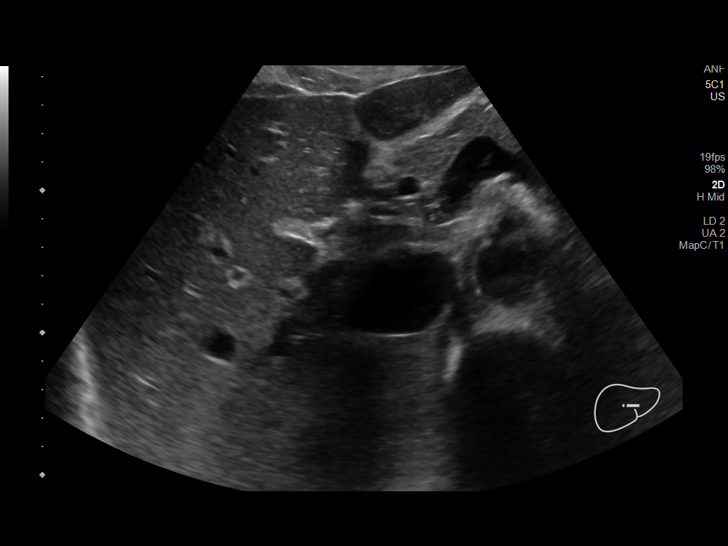
[im 26/51]
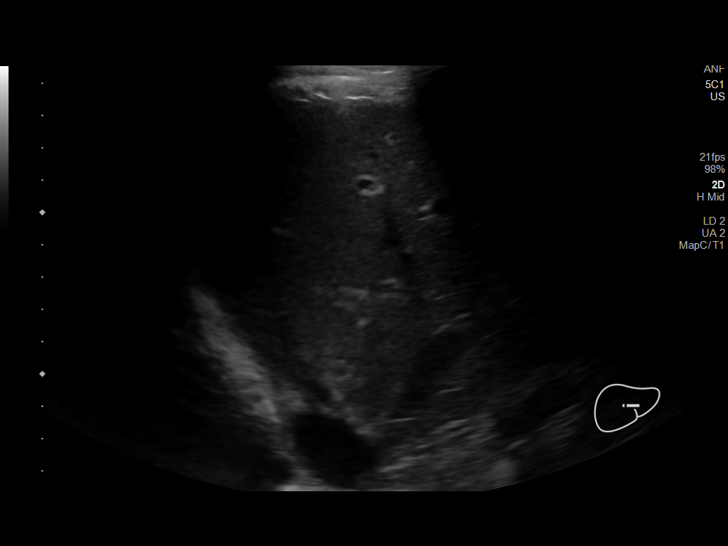
[im 30/51]
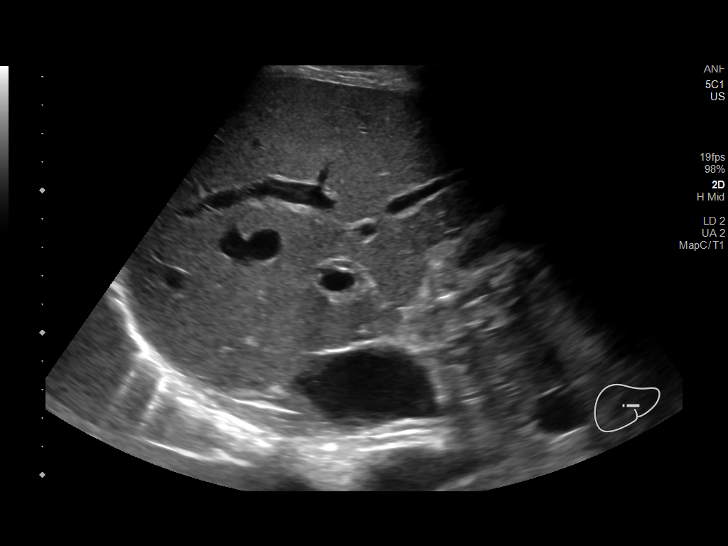
[im 34/51]
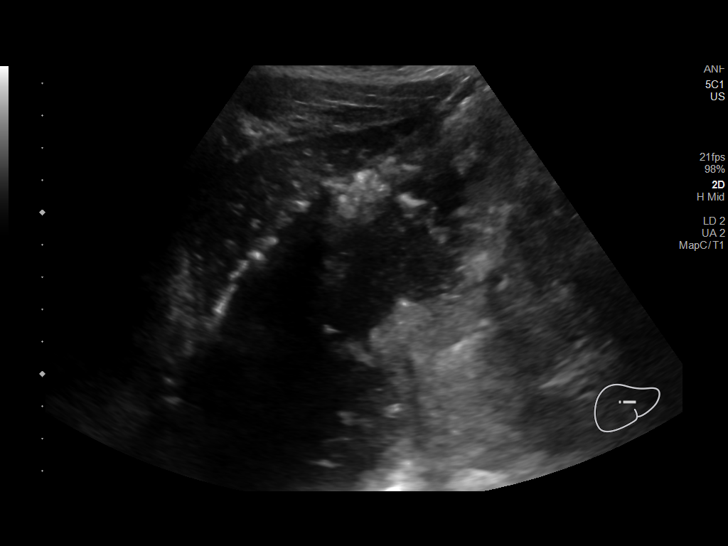
[im 38/51]
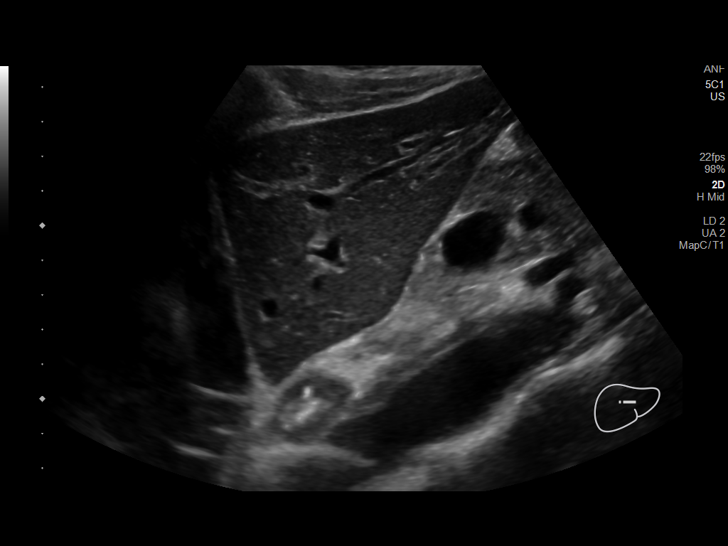
[im 42/51]
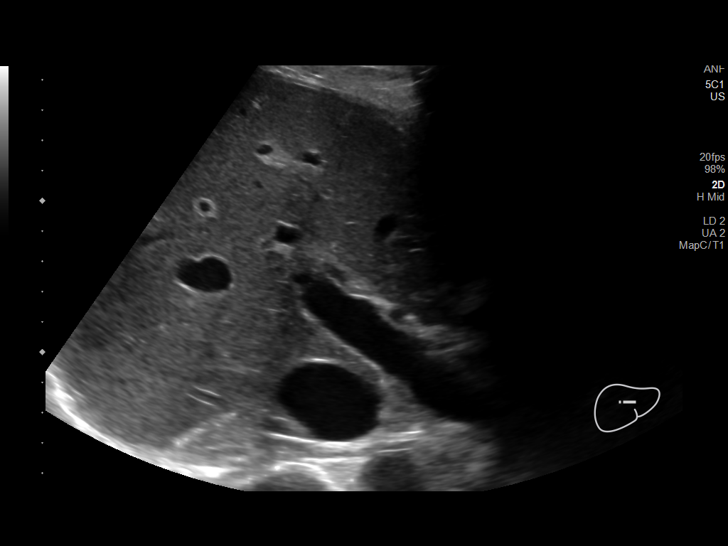
[im 46/51]
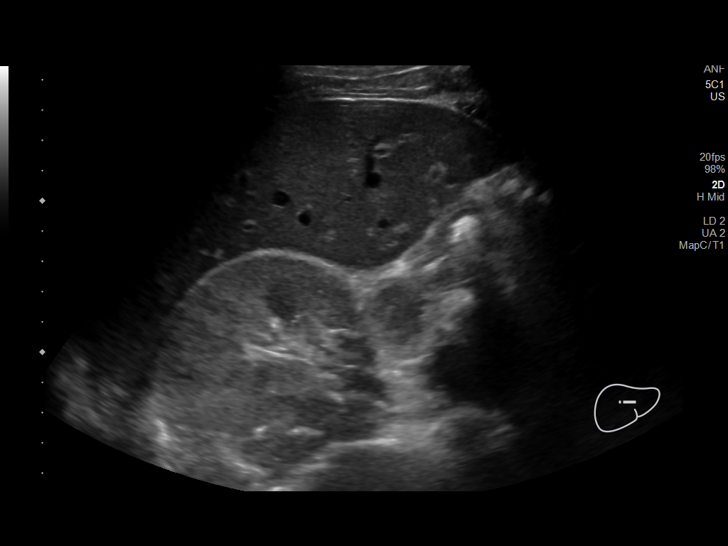
[im 51/51]
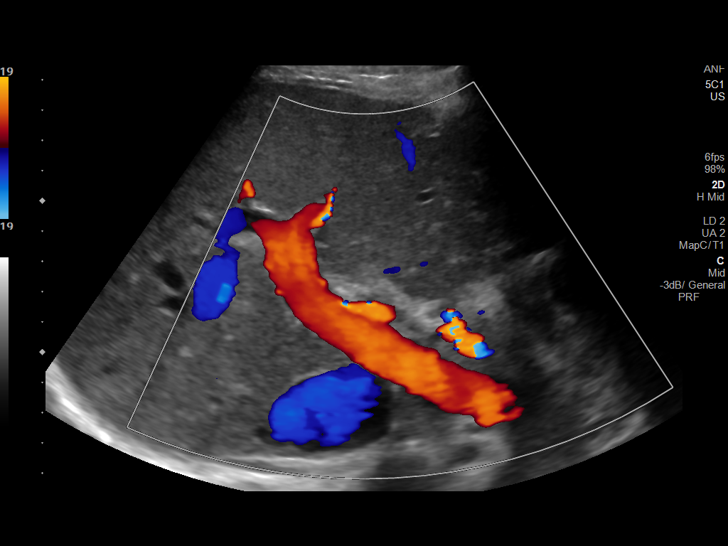

[13 of 25 positions shown; findings below may reference images not displayed]

FINDINGS: Gallbladder:

The gallbladder is suboptimally evaluated as the patient is not NPO.
Mild apparent gallbladder wall thickening, potentially accentuated
due to underdistention due to non NPO status. No echogenic
gallstones or biliary sludge. No definitive pericholecystic fluid.
Negative sonographic Murphy sign.

Common bile duct:

Diameter: Normal in size measuring 3 mm in diameter

Liver:

Questioned increased echogenicity about portal [REDACTED] (starry sky
appearance). Otherwise, normal echogenicity of the hepatic
parenchyma. Question minimal amount of intrahepatic biliary ductal
dilatation (representative image 39. No discrete hepatic lesions.
Portal vein is patent on color Doppler imaging with normal direction
of blood flow towards the liver. No ascites.

Other: Incidentally noted diffuse increased echogenicity of the
incidentally imaged right kidney without evidence of urinary
obstruction (representative image 32).
IMPRESSION: 1. Questioned "starry sky" appearance of the hepatic parenchyma as
could be seen in the setting of an acute hepatitis. No discrete
hepatic lesions. No ascites.
2. Suspected minimal intrahepatic biliary duct dilatation.
3. Apparent gallbladder wall thickening, potentially accentuated due
to patient's non NPO status. No echogenic stones or biliary sludge.
Negative sonographic Murphy's sign.
4. Increased echogenicity of the right kidney without evidence of
urinary obstruction, nonspecific though could be seen in the setting
medical renal disease.

## 2021-08-04 IMAGING — CT CT ABD-PELV W/ CM
2 of 4 series · 15 of 46 positions shown, 17 images · IV contrast (APPLIED)
Comparison: Abdominal ultrasound 07/10/2019

CLINICAL DATA: Acute abdominal pain and vomiting.

EXAM:
CT ABDOMEN AND PELVIS WITH CONTRAST
TECHNIQUE: Multidetector CT imaging of the abdomen and pelvis was performed
using the standard protocol following bolus administration of
intravenous contrast.
CONTRAST:  80mL OMNIPAQUE IOHEXOL 300 MG/ML  SOLN

[Series 3: abdomen 5.0 · axial · 0.75mm/px · z∈[+883,+1293]mm · 12 of 92 slices shown, 14 images]
[im 5/92  soft-tissue]
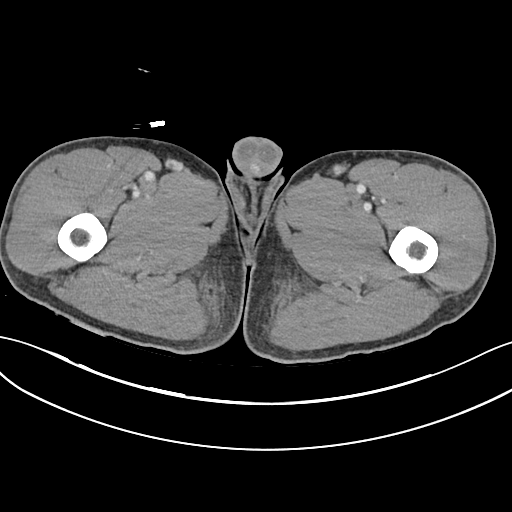
[im 5/92  bone]
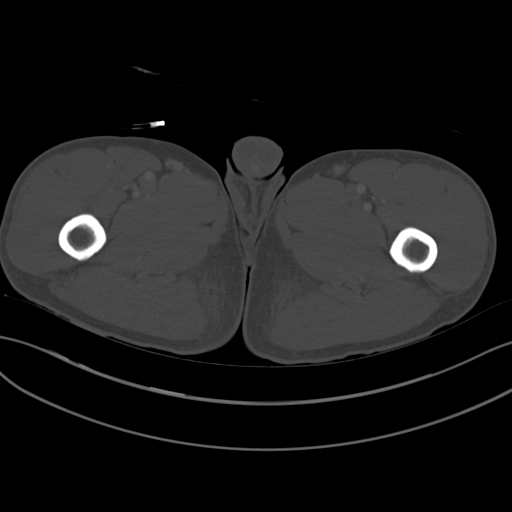
[im 15/92  soft-tissue]
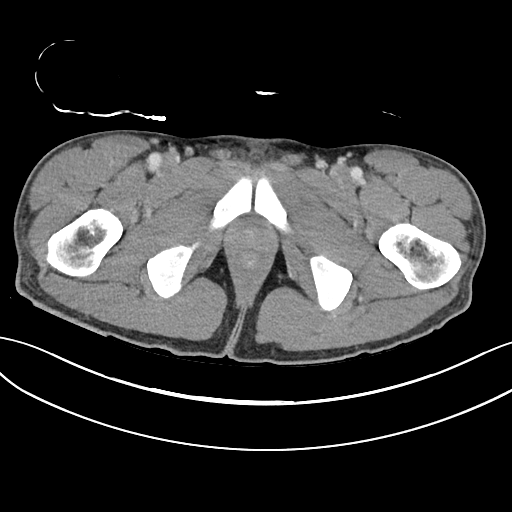
[im 20/92  soft-tissue]
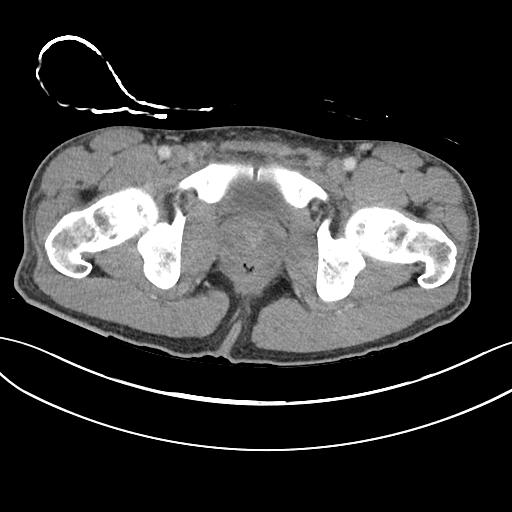
[im 29/92  soft-tissue]
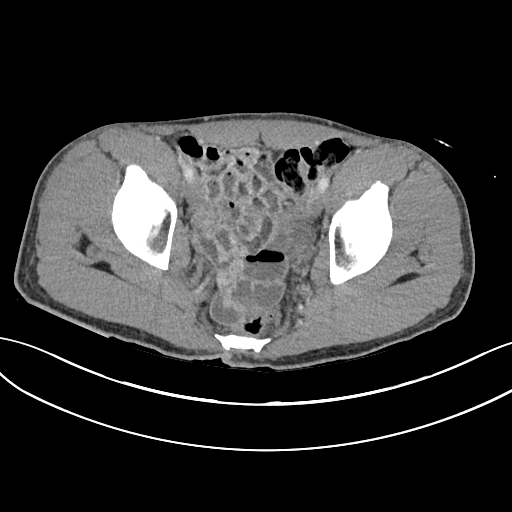
[im 34/92  soft-tissue]
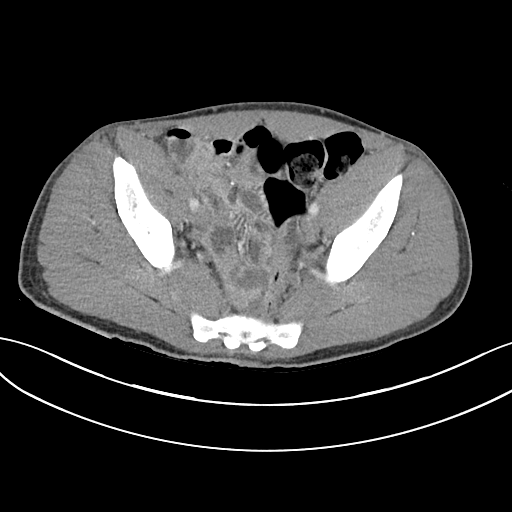
[im 44/92  soft-tissue]
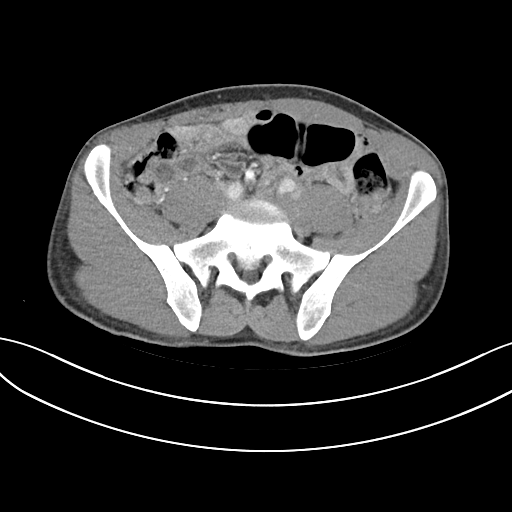
[im 48/92  soft-tissue]
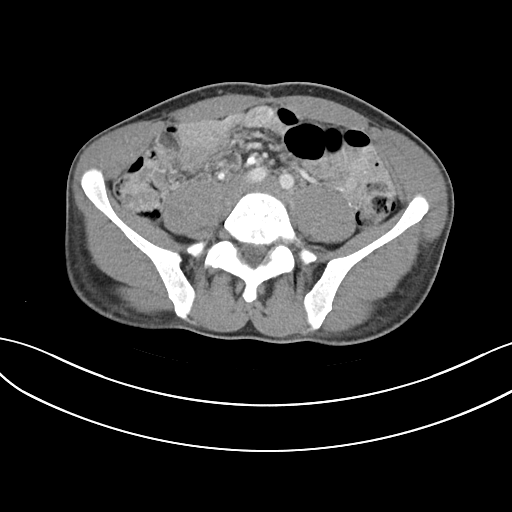
[im 58/92  soft-tissue]
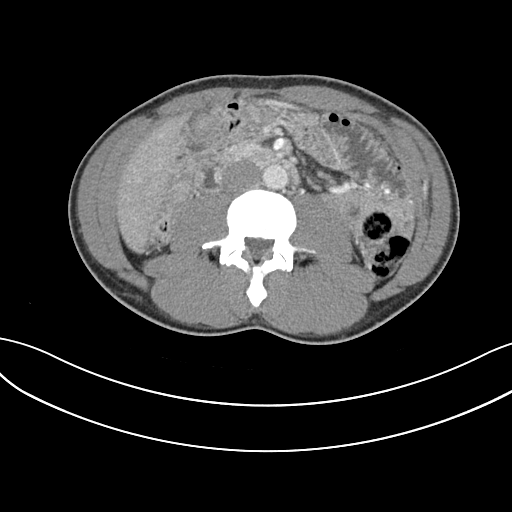
[im 63/92  soft-tissue]
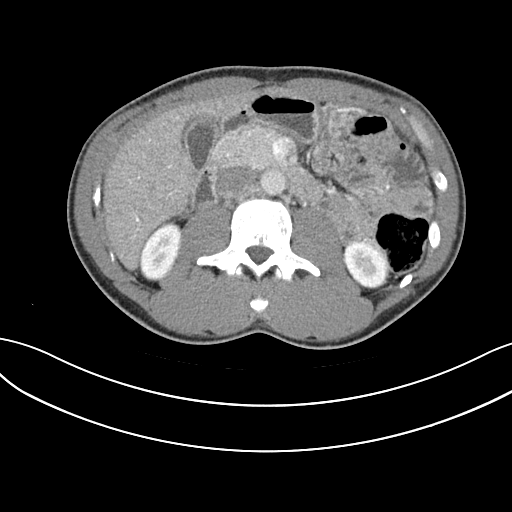
[im 63/92  bone]
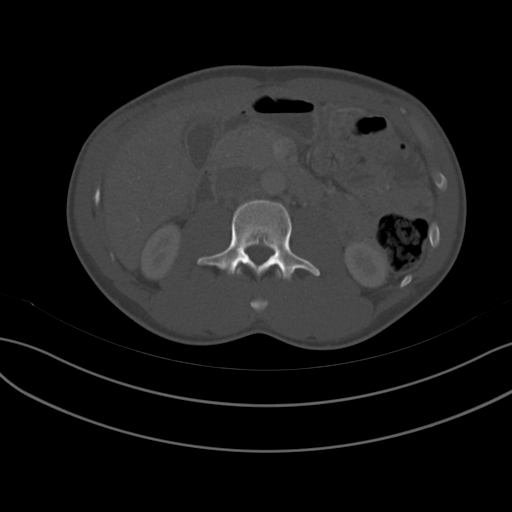
[im 72/92  soft-tissue]
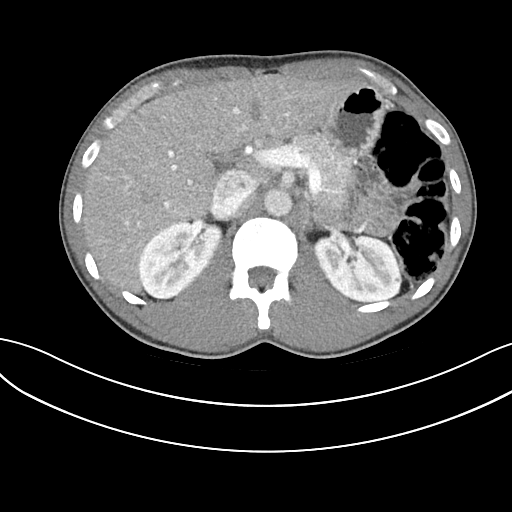
[im 77/92  soft-tissue]
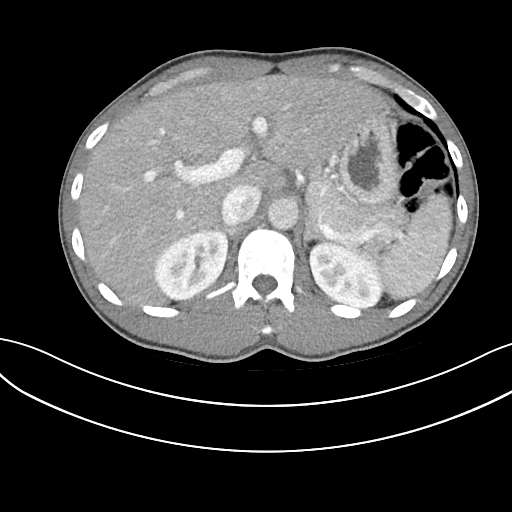
[im 87/92  soft-tissue]
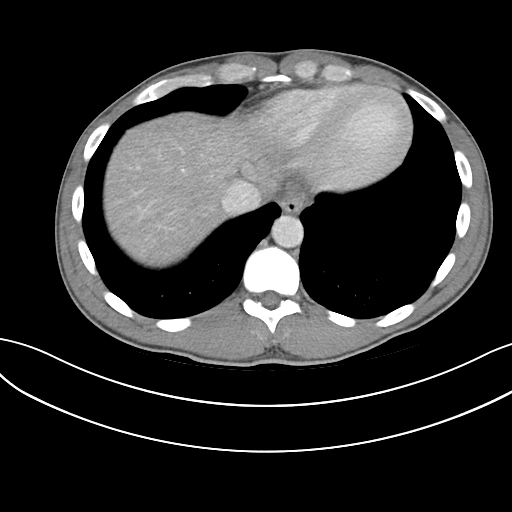

[Series 6: abdomen 3.0 mpr cor · coronal · 0.73mm/px · 3 of 83 slices shown]
[im 28/83  soft-tissue]
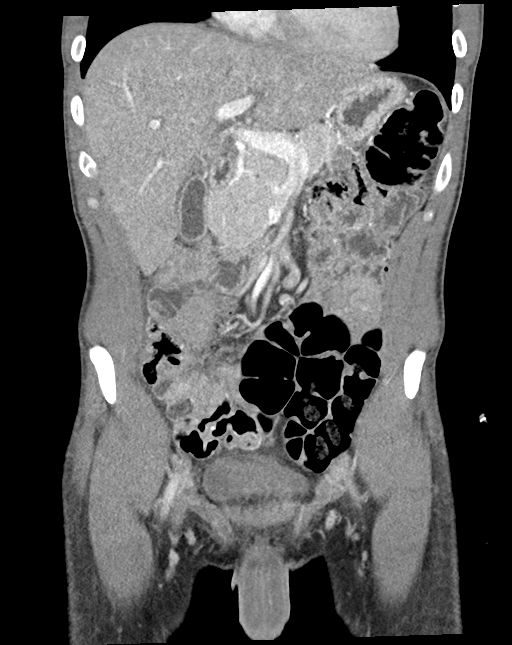
[im 37/83  soft-tissue]
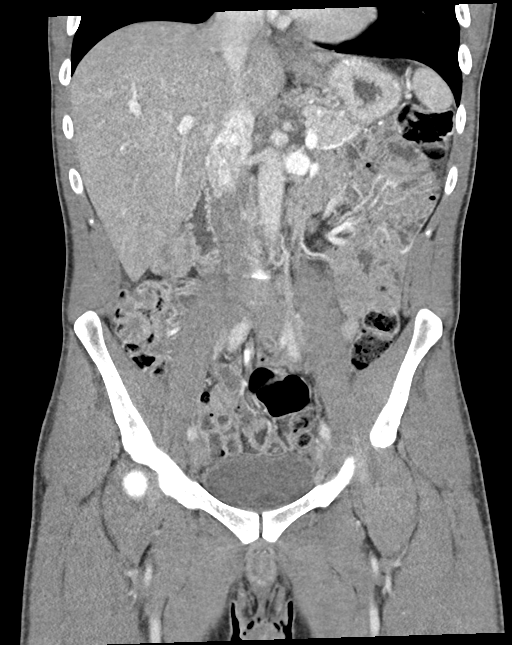
[im 46/83  soft-tissue]
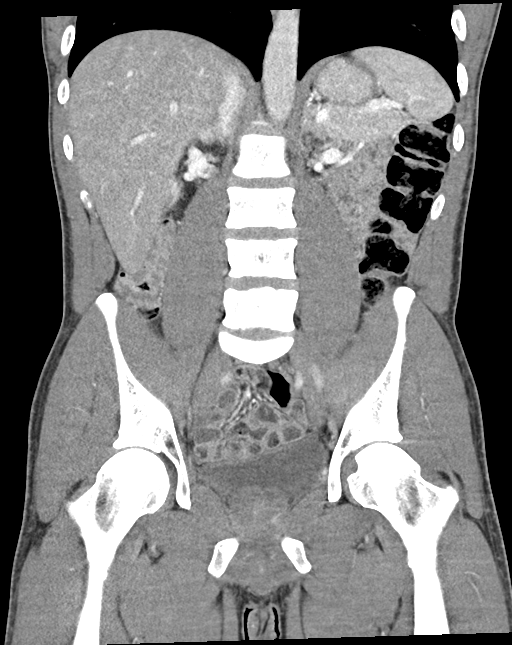

[15 of 46 positions shown; findings below may reference images not displayed]

FINDINGS: Lower chest: The lung bases are clear of acute process. No pleural
effusion or pulmonary lesions. The heart is normal in size. No
pericardial effusion. The distal esophagus and aorta are
unremarkable.

Hepatobiliary: No focal hepatic lesions or intrahepatic biliary
dilatation. Decreased attenuation and slightly heterogeneous
appearance of the liver could be due to fatty infiltration or
hepatic disease such as hepatitis.

The gallbladder is not distended but there is mucosal enhancement
and pericholecystic fluid or diffuse edema in the gallbladder wall.
No obvious gallstones by CT. No gallstones seen on recent ultrasound
either. Normal caliber and course of the common bile duct.

Pancreas: No mass, inflammation or ductal dilatation.

Spleen: Normal size. No focal lesions.

Adrenals/Urinary Tract: The adrenal glands and kidneys are
unremarkable. The bladder is normal.

Stomach/Bowel: The stomach, duodenum, small bowel and colon are
grossly normal without oral contrast. No inflammatory changes, mass
lesions or obstructive findings. The appendix is normal.

Vascular/Lymphatic: The aorta is normal in caliber. No dissection.
The branch vessels are patent. The major venous structures are
patent. No mesenteric or retroperitoneal mass or adenopathy. Small
scattered lymph nodes are noted.

Reproductive: The prostate gland and seminal vesicles are
unremarkable.

Other: No pelvic mass or adenopathy. No free pelvic fluid
collections. No inguinal mass or adenopathy. No abdominal wall
hernia or subcutaneous lesions.

Musculoskeletal: No significant bony findings. Small symmetric
largely sclerotic bone lesions in both iliac bones, likely small
bone infarcts.
IMPRESSION: 1. Thickened gallbladder wall and or pericholecystic fluid could be
due to acalculus cholecystitis or underlying parenchymal liver
disease and low albumin.
2. Decreased attenuation and slight heterogeneous appearance of the
liver may suggest underlying liver disease/hepatitis.
3. No other significant acute abdominal/pelvic findings, mass
lesions or adenopathy.
